# Patient Record
Sex: Male | Born: 1960 | Race: White | Hispanic: No | Marital: Single | Smoking: Never smoker
Health system: Southern US, Community
[De-identification: ages and names within clinical notes are randomized; demographics above are authoritative.]

## PROBLEM LIST (undated history)

## (undated) DIAGNOSIS — T7840XA Allergy, unspecified, initial encounter: Secondary | ICD-10-CM

## (undated) DIAGNOSIS — F329 Major depressive disorder, single episode, unspecified: Secondary | ICD-10-CM

## (undated) DIAGNOSIS — G4700 Insomnia, unspecified: Secondary | ICD-10-CM

## (undated) DIAGNOSIS — E669 Obesity, unspecified: Secondary | ICD-10-CM

## (undated) DIAGNOSIS — G4730 Sleep apnea, unspecified: Secondary | ICD-10-CM

## (undated) DIAGNOSIS — I10 Essential (primary) hypertension: Secondary | ICD-10-CM

## (undated) DIAGNOSIS — L649 Androgenic alopecia, unspecified: Secondary | ICD-10-CM

## (undated) DIAGNOSIS — F32A Depression, unspecified: Secondary | ICD-10-CM

## (undated) DIAGNOSIS — F419 Anxiety disorder, unspecified: Secondary | ICD-10-CM

## (undated) DIAGNOSIS — E291 Testicular hypofunction: Secondary | ICD-10-CM

## (undated) DIAGNOSIS — E785 Hyperlipidemia, unspecified: Secondary | ICD-10-CM

## (undated) HISTORY — DX: Androgenic alopecia, unspecified: L64.9

## (undated) HISTORY — DX: Hyperlipidemia, unspecified: E78.5

## (undated) HISTORY — PX: NASAL SEPTUM SURGERY: SHX37

## (undated) HISTORY — DX: Major depressive disorder, single episode, unspecified: F32.9

## (undated) HISTORY — DX: Sleep apnea, unspecified: G47.30

## (undated) HISTORY — DX: Depression, unspecified: F32.A

## (undated) HISTORY — DX: Obesity, unspecified: E66.9

## (undated) HISTORY — DX: Allergy, unspecified, initial encounter: T78.40XA

## (undated) HISTORY — DX: Testicular hypofunction: E29.1

## (undated) HISTORY — DX: Insomnia, unspecified: G47.00

## (undated) HISTORY — DX: Anxiety disorder, unspecified: F41.9

## (undated) HISTORY — DX: Essential (primary) hypertension: I10

---

## 1997-09-05 ENCOUNTER — Ambulatory Visit: Admission: RE | Admit: 1997-09-05 | Discharge: 1997-09-05 | Payer: Self-pay | Admitting: *Deleted

## 1997-09-23 ENCOUNTER — Ambulatory Visit: Admission: RE | Admit: 1997-09-23 | Discharge: 1997-09-23 | Payer: Self-pay | Admitting: *Deleted

## 1998-01-26 ENCOUNTER — Encounter: Admission: RE | Admit: 1998-01-26 | Discharge: 1998-02-10 | Payer: Self-pay | Admitting: Neurosurgery

## 1999-11-11 ENCOUNTER — Encounter: Admission: RE | Admit: 1999-11-11 | Discharge: 2000-02-09 | Payer: Self-pay | Admitting: Pulmonary Disease

## 2002-03-14 ENCOUNTER — Ambulatory Visit (HOSPITAL_BASED_OUTPATIENT_CLINIC_OR_DEPARTMENT_OTHER): Admission: RE | Admit: 2002-03-14 | Discharge: 2002-03-14 | Payer: Self-pay | Admitting: Pulmonary Disease

## 2004-07-12 ENCOUNTER — Ambulatory Visit: Payer: Self-pay | Admitting: Pulmonary Disease

## 2004-08-12 ENCOUNTER — Ambulatory Visit (HOSPITAL_BASED_OUTPATIENT_CLINIC_OR_DEPARTMENT_OTHER): Admission: RE | Admit: 2004-08-12 | Discharge: 2004-08-12 | Payer: Self-pay | Admitting: Pulmonary Disease

## 2004-08-16 ENCOUNTER — Ambulatory Visit: Payer: Self-pay | Admitting: Pulmonary Disease

## 2004-09-01 ENCOUNTER — Ambulatory Visit: Payer: Self-pay | Admitting: Pulmonary Disease

## 2004-11-10 ENCOUNTER — Ambulatory Visit (HOSPITAL_COMMUNITY): Admission: RE | Admit: 2004-11-10 | Discharge: 2004-11-11 | Payer: Self-pay | Admitting: Otolaryngology

## 2005-06-13 ENCOUNTER — Ambulatory Visit (HOSPITAL_COMMUNITY): Admission: RE | Admit: 2005-06-13 | Discharge: 2005-06-13 | Payer: Self-pay | Admitting: Pulmonary Disease

## 2005-07-04 ENCOUNTER — Ambulatory Visit: Payer: Self-pay | Admitting: Family Medicine

## 2005-10-05 ENCOUNTER — Ambulatory Visit: Payer: Self-pay | Admitting: Family Medicine

## 2006-01-18 ENCOUNTER — Ambulatory Visit: Payer: Self-pay | Admitting: Family Medicine

## 2006-02-05 ENCOUNTER — Ambulatory Visit: Payer: Self-pay | Admitting: Family Medicine

## 2006-02-15 ENCOUNTER — Ambulatory Visit: Payer: Self-pay | Admitting: Family Medicine

## 2006-05-09 ENCOUNTER — Ambulatory Visit: Payer: Self-pay | Admitting: Family Medicine

## 2006-05-23 ENCOUNTER — Ambulatory Visit: Payer: Self-pay | Admitting: Family Medicine

## 2006-12-25 ENCOUNTER — Ambulatory Visit: Payer: Self-pay | Admitting: Family Medicine

## 2007-01-01 ENCOUNTER — Ambulatory Visit: Payer: Self-pay | Admitting: Family Medicine

## 2007-01-08 ENCOUNTER — Ambulatory Visit: Payer: Self-pay | Admitting: Family Medicine

## 2007-01-18 ENCOUNTER — Ambulatory Visit: Payer: Self-pay | Admitting: Family Medicine

## 2007-02-14 ENCOUNTER — Ambulatory Visit: Payer: Self-pay | Admitting: Family Medicine

## 2007-02-27 ENCOUNTER — Ambulatory Visit: Payer: Self-pay | Admitting: Family Medicine

## 2007-03-01 ENCOUNTER — Ambulatory Visit: Payer: Self-pay | Admitting: Family Medicine

## 2007-04-04 ENCOUNTER — Ambulatory Visit: Payer: Self-pay | Admitting: Family Medicine

## 2007-05-17 ENCOUNTER — Ambulatory Visit: Payer: Self-pay | Admitting: Family Medicine

## 2007-11-26 ENCOUNTER — Ambulatory Visit: Payer: Self-pay | Admitting: Family Medicine

## 2007-11-27 IMAGING — CT CT MAXILLOFACIAL W/O CM
1 of 2 series · 16 of 30 positions shown, 20 images · IV contrast (agent unspecified)
Comparison: None.

CLINICAL DATA: Chronic sinusitis.  History of septoplasty 20 years ago and nasal surgery 3223. 
 MAXILLOFACIAL CT WITHOUT CONTRAST:
TECHNIQUE: Axial and coronal CT imaging was performed through the maxillofacial structures.  No intravenous contrast was administered.

[Series 3: recon 2: sinus prone · axial · 0.33mm/px · z∈[+139,+230]mm · 16 of 40 slices shown, 20 images]
[im 3/40  brain]
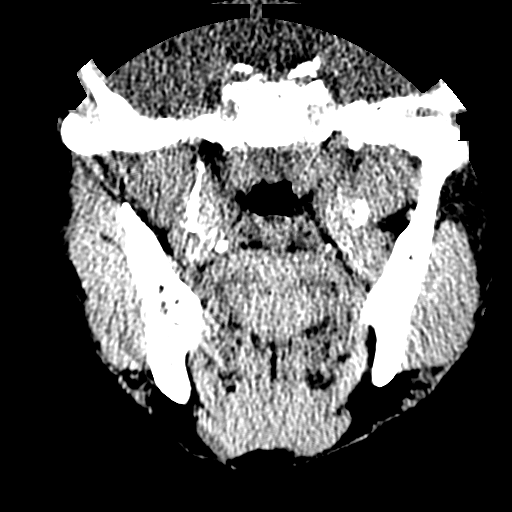
[im 3/40  bone]
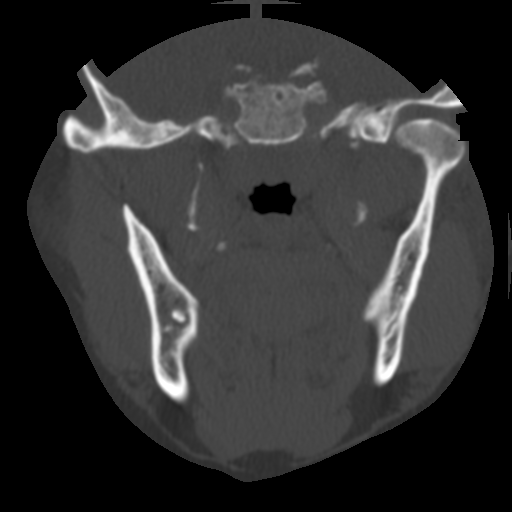
[im 5/40  bone]
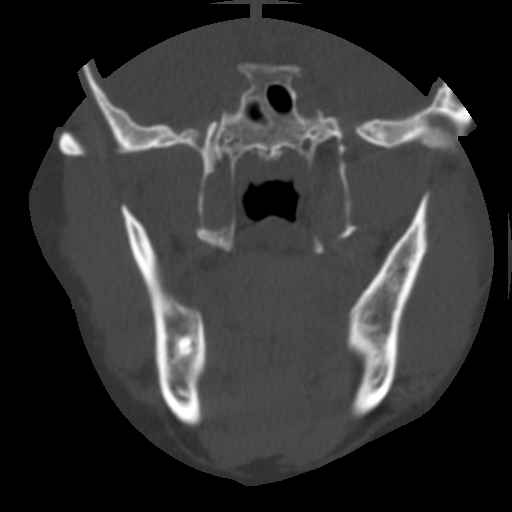
[im 7/40  bone]
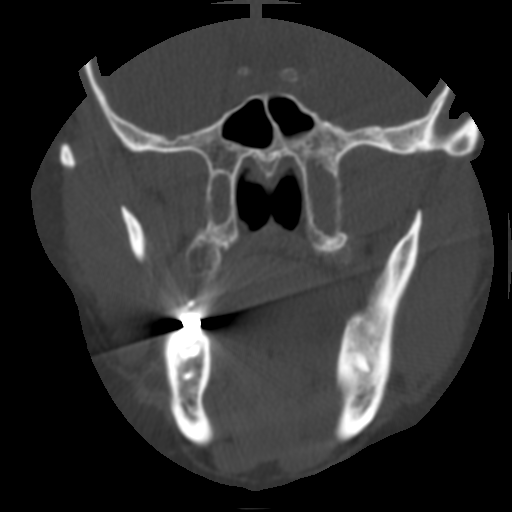
[im 10/40  bone]
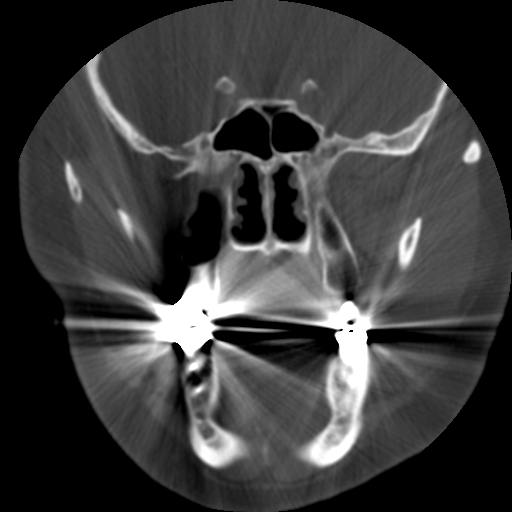
[im 12/40  brain]
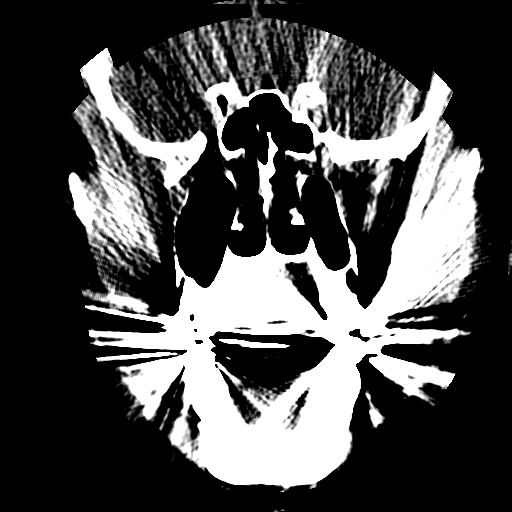
[im 12/40  bone]
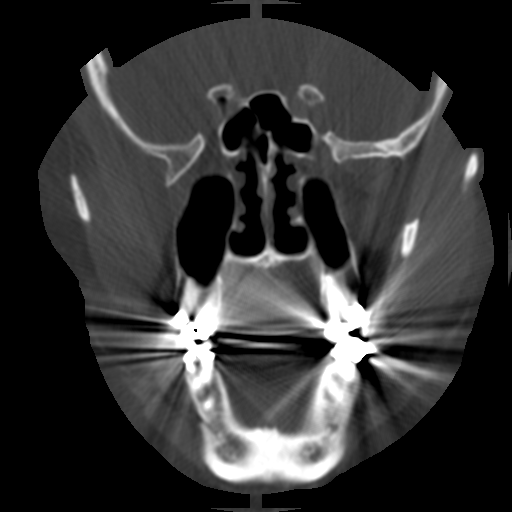
[im 14/40  bone]
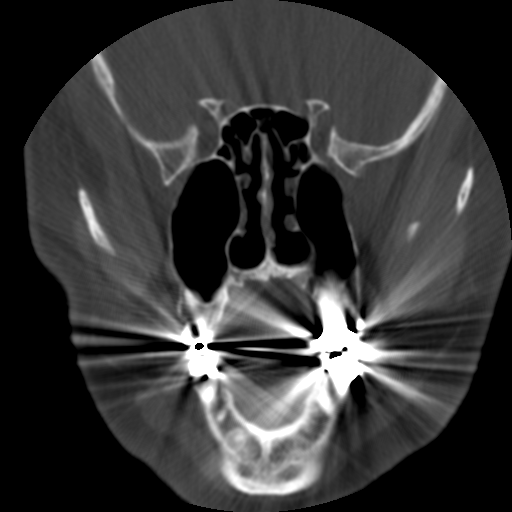
[im 17/40  bone]
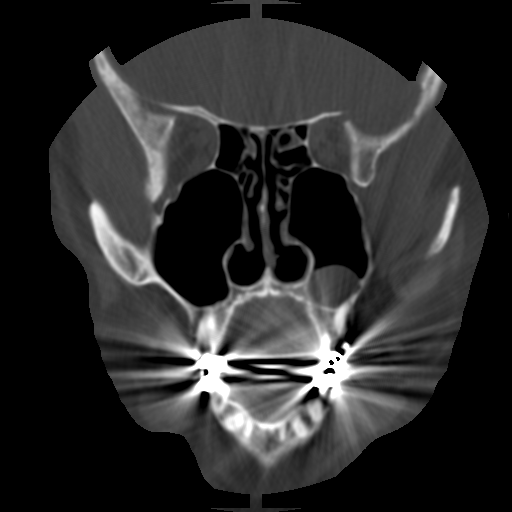
[im 19/40  bone]
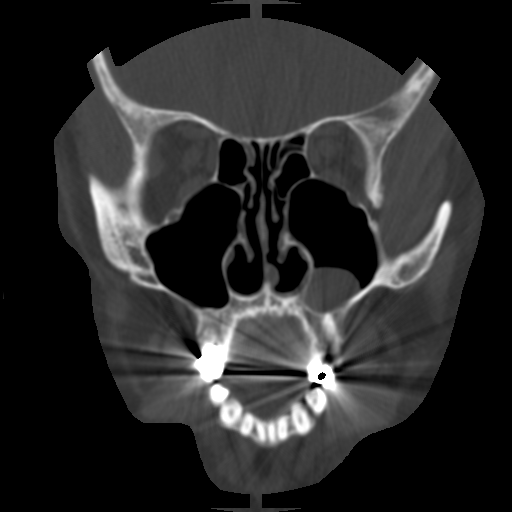
[im 21/40  brain]
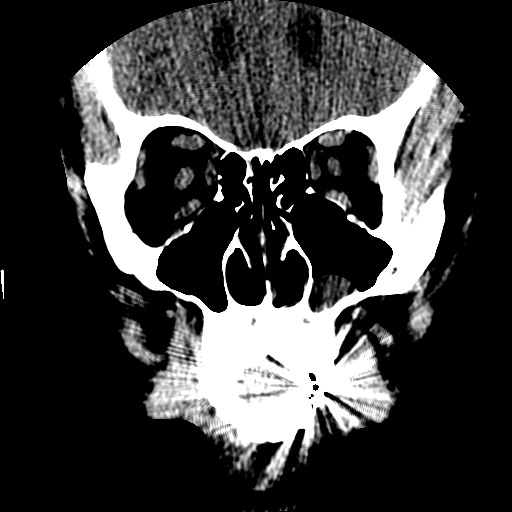
[im 21/40  bone]
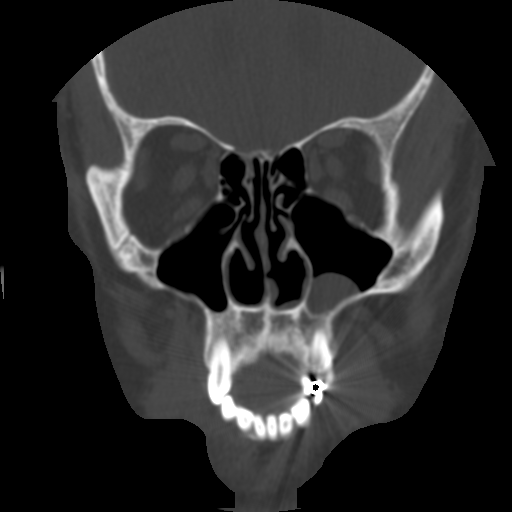
[im 23/40  bone]
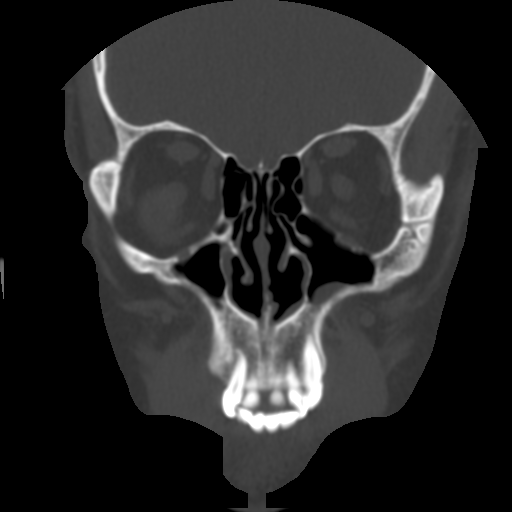
[im 26/40  bone]
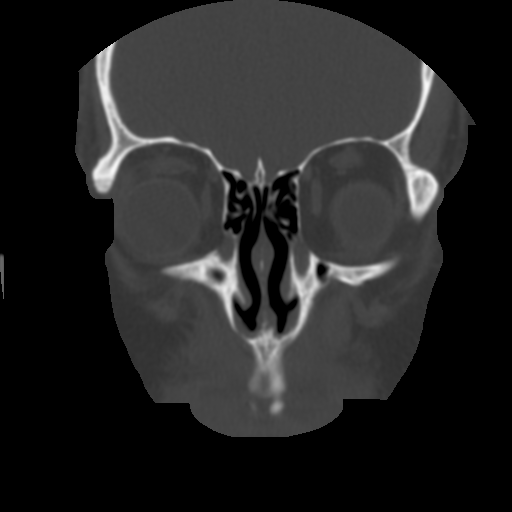
[im 28/40  bone]
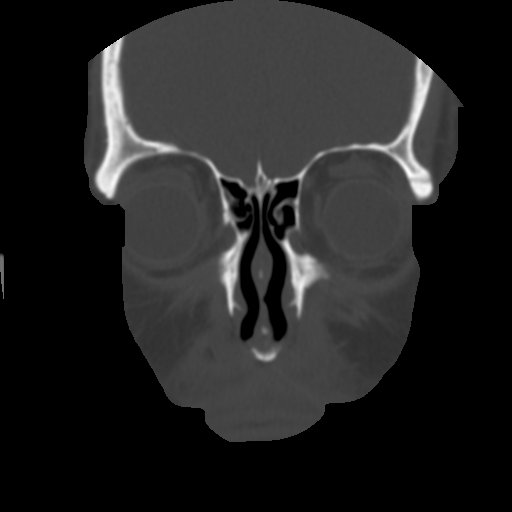
[im 30/40  brain]
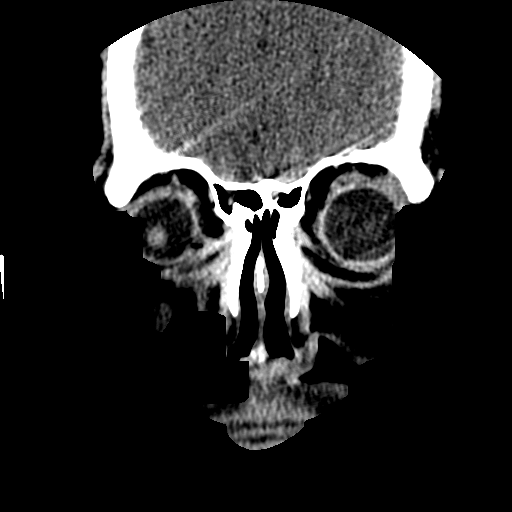
[im 30/40  bone]
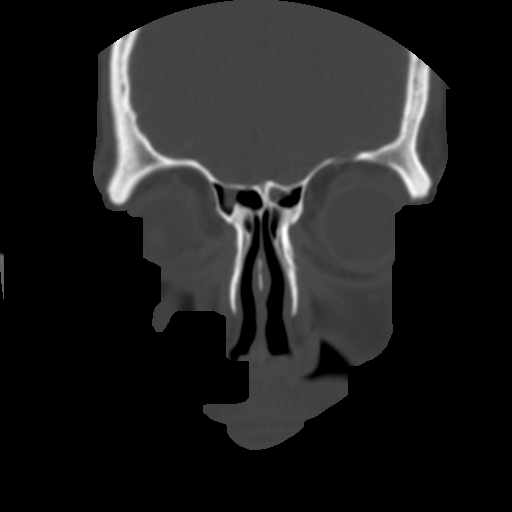
[im 33/40  bone]
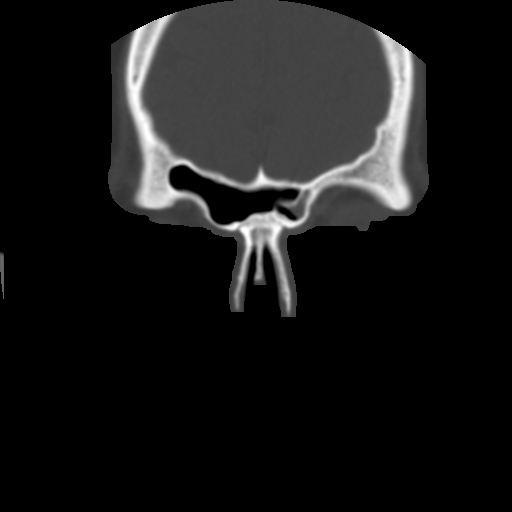
[im 35/40  bone]
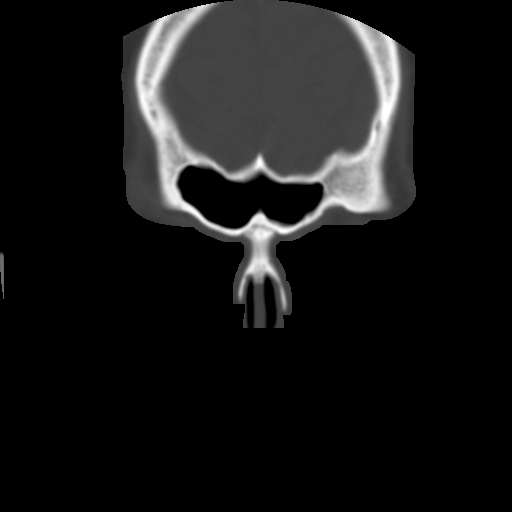
[im 37/40  bone]
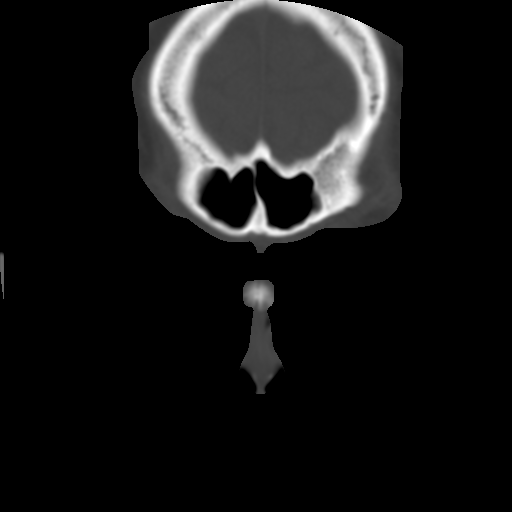

[16 of 30 positions shown; findings below may reference images not displayed]

FINDINGS: There is a retention cyst in the base of the right maxillary antrum.  There is some mild mucosal edema in the left maxillary sinus and some mild mucosal edema in the ostium of the left maxillary sinus.  The ostiomeatal complex is patent bilaterally.  There are no air-fluid levels.  There is some mucosal thickening in the frontal sinuses bilaterally. There is no bone destruction.  The nasal septum is midline.
IMPRESSION: Sinus mucosal disease as above without air-fluid level.

## 2007-12-03 ENCOUNTER — Ambulatory Visit: Payer: Self-pay | Admitting: Family Medicine

## 2008-01-10 ENCOUNTER — Ambulatory Visit: Payer: Self-pay | Admitting: Family Medicine

## 2008-01-14 ENCOUNTER — Ambulatory Visit: Payer: Self-pay | Admitting: Family Medicine

## 2008-03-10 ENCOUNTER — Ambulatory Visit: Payer: Self-pay | Admitting: Family Medicine

## 2009-01-11 ENCOUNTER — Ambulatory Visit: Payer: Self-pay | Admitting: Family Medicine

## 2009-02-15 ENCOUNTER — Ambulatory Visit: Payer: Self-pay | Admitting: Family Medicine

## 2009-03-09 ENCOUNTER — Ambulatory Visit: Payer: Self-pay | Admitting: Family Medicine

## 2009-04-08 ENCOUNTER — Ambulatory Visit: Payer: Self-pay | Admitting: Family Medicine

## 2009-05-04 ENCOUNTER — Ambulatory Visit: Payer: Self-pay | Admitting: Family Medicine

## 2009-10-25 ENCOUNTER — Ambulatory Visit: Payer: Self-pay | Admitting: Family Medicine

## 2009-11-02 ENCOUNTER — Ambulatory Visit: Payer: Self-pay | Admitting: Family Medicine

## 2009-11-25 ENCOUNTER — Ambulatory Visit: Payer: Self-pay | Admitting: Family Medicine

## 2009-12-27 ENCOUNTER — Ambulatory Visit: Payer: Self-pay | Admitting: Family Medicine

## 2010-05-19 ENCOUNTER — Encounter: Payer: Self-pay | Admitting: Family Medicine

## 2010-06-17 NOTE — Op Note (Signed)
Thomas Blankenship, Thomas Blankenship NO.:  [id number]   MEDICAL RECORD NO.:  [id number]          PATIENT TYPE:  INP   LOCATION:  2899                         FACILITY:  [facility]   PHYSICIAN:  Lucky Cowboy, MD         DATE OF BIRTH:  30-Dec-1960   DATE OF PROCEDURE:  11/10/2004  DATE OF DISCHARGE:                                 OPERATIVE REPORT   PREOPERATIVE DIAGNOSIS:  Bilateral inferior turbinate hypertrophy with a  severe obstructive sleep apnea.   POSTOPERATIVE DIAGNOSIS:  Bilateral inferior turbinate hypertrophy with a  severe obstructive sleep apnea.   PROCEDURE:  Bilateral inferior turbinate reductions.   SURGEON:  Dr. Lucky Cowboy.   ANESTHESIA:  General endotracheal anesthesia.   ESTIMATED BLOOD LOSS:  Less than 50 mL.   SPECIMENS:  None.   COMPLICATIONS:  None.   INDICATIONS:  The patient is a 50 year old male with a 2-year history of  nasal congestion causing difficulty tolerating the CPAP machine.  He has an  RDI, a respiratory disturbance index of 75. He has been trying to wear the  CPAP machine at night, but he is waking up with a morning headache. He is  having difficulty wearing the CPAP machine in the morning due to nasal  congestion. He is using Flonase, Astelin, Singulair and Claritin without  relief of nasal congestion. He has undergone septoplasty but is having  obstructing inferior turbinate hypertrophy and for these reasons, turbinate  reduction is performed.   FINDINGS:  The patient was noted to have both bony and mucosal inferior  turbinate hypertrophy.   PROCEDURE:  The patient was taken to the operating room and placed under  general endotracheal anesthesia. The table was rotated counterclockwise 45  degrees. Each of the nasal cavities were decongested with Afrin on cottonoid  pledgets. Both the inferior turbinates were injected with 1% lidocaine with  1:100,000 of epinephrine. After allowing time for vasoconstrictive effect,  the inferior  turbinates were reduced with the microdebrider. This allowed  exposure of the bony inferior turbinates which were taken down using the  through cut forceps. There was much more bone on the left side. The suction  cautery was then used to performed hemostasis. Bactroban ointment coated  rolled Telfa was then placed in each of the nasal cavities. There were tied  to one another anterior to the columella. The oral  cavity was suctioned. The table was rotated counterclockwise 45 degrees to  its original position. The patient was awakened from anesthesia and taken to  the Post Anesthesia Care Unit in stable condition. He will be observed  overnight in 3300 and discharged in the morning.      Lucky Cowboy, MD  Electronically Signed     SJ/MEDQ  D:  11/10/2004  T:  11/10/2004  Job:  [id number]   cc:   Sharlot Gowda, M.D.  Fax: [phone]

## 2010-06-17 NOTE — Procedures (Signed)
Thomas Blankenship, Thomas Blankenship NO.:  [id number]   MEDICAL RECORD NO.:  [id number]          PATIENT TYPE:  OUT   LOCATION:  SLEEP CENTER                 FACILITY:  [facility]   PHYSICIAN:  Marcelyn Bruins, M.D. [facility] DATE OF BIRTH:  1960-11-18   DATE OF STUDY:  08/12/2004                              NOCTURNAL POLYSOMNOGRAM   REFERRING PHYSICIAN:  Oley Balm. Sung Amabile, M.D.   INDICATION FOR STUDY:  Hypersomnia with sleep apnea. The patient has been  diagnosed previously with sleep apnea and returns for pressure optimization  because of ongoing symptoms.   EPWORTH SCORE: 12.   SLEEP ARCHITECTURE:  The patient had a total sleep time of 370 minutes with  decreased REM but adequate slow wave sleep. Sleep onset latency was normal  and REM onset was very prolonged.   IMPRESSION:  1.  CPAP titration study reveals good control of previously documented      obstructive sleep apnea with 13-16 cm of water pressure. The patient had      resolution of his apneas at 13, however, had breakthrough snoring until      his pressure was increased to 16. The patient used his usual mask from      home.  2.  No clinically significant cardiac arrhythmias.  3.  Very large numbers of leg jerks with moderate sleep disruption. Clinical      correlation is suggested. Does the patient have a history suggestive of      the restless leg syndrome? His leg movements may be contributing to his      persistent daytime sleepiness.     ______________________________  Suzzette Righter    KC/MEDQ  D:  08/16/2004 17:08:59  T:  08/16/2004 23:40:14  Job:  [id number]

## 2010-07-05 ENCOUNTER — Other Ambulatory Visit: Payer: Self-pay | Admitting: Family Medicine

## 2010-07-06 NOTE — Telephone Encounter (Signed)
Please review refill request 

## 2010-07-07 ENCOUNTER — Telehealth: Payer: Self-pay | Admitting: Family Medicine

## 2010-07-07 ENCOUNTER — Other Ambulatory Visit: Payer: Self-pay | Admitting: Family Medicine

## 2010-07-07 DIAGNOSIS — E291 Testicular hypofunction: Secondary | ICD-10-CM

## 2010-07-07 MED ORDER — TESTOSTERONE 12.5 MG/ACT (1%) TD GEL: 4.0000 | Freq: Every day | TRANSDERMAL | Status: DC

## 2010-07-07 NOTE — Telephone Encounter (Signed)
Left msg for pt to call for appt per JCL-lm

## 2010-07-07 NOTE — Telephone Encounter (Signed)
Forwarded to [facility]

## 2010-07-12 ENCOUNTER — Encounter: Payer: Self-pay | Admitting: Family Medicine

## 2010-07-12 ENCOUNTER — Ambulatory Visit (INDEPENDENT_AMBULATORY_CARE_PROVIDER_SITE_OTHER): Payer: BC Managed Care – PPO | Admitting: Family Medicine

## 2010-07-12 DIAGNOSIS — E291 Testicular hypofunction: Secondary | ICD-10-CM

## 2010-07-12 DIAGNOSIS — F329 Major depressive disorder, single episode, unspecified: Secondary | ICD-10-CM

## 2010-07-12 DIAGNOSIS — Z79899 Other long term (current) drug therapy: Secondary | ICD-10-CM

## 2010-07-12 DIAGNOSIS — E669 Obesity, unspecified: Secondary | ICD-10-CM

## 2010-07-12 DIAGNOSIS — G4730 Sleep apnea, unspecified: Secondary | ICD-10-CM

## 2010-07-12 DIAGNOSIS — I10 Essential (primary) hypertension: Secondary | ICD-10-CM | POA: Insufficient documentation

## 2010-07-12 LAB — CBC WITH DIFFERENTIAL/PLATELET
Basophils Absolute: 0 10*3/uL (ref 0.0–0.1)
Basophils Relative: 1 % (ref 0–1)
Eosinophils Absolute: 0.2 10*3/uL (ref 0.0–0.7)
HCT: 51.6 % (ref 39.0–52.0)
Hemoglobin: 17.7 g/dL — ABNORMAL HIGH (ref 13.0–17.0)
Lymphocytes Relative: 30 % (ref 12–46)
Lymphs Abs: 1.8 10*3/uL (ref 0.7–4.0)
MCH: 30.4 pg (ref 26.0–34.0)
MCHC: 34.3 g/dL (ref 30.0–36.0)
MCV: 88.5 fL (ref 78.0–100.0)
Monocytes Relative: 11 % (ref 3–12)
Neutro Abs: 3.5 10*3/uL (ref 1.7–7.7)
Neutrophils Relative %: 56 % (ref 43–77)
Platelets: 221 10*3/uL (ref 150–400)
RBC: 5.83 MIL/uL — ABNORMAL HIGH (ref 4.22–5.81)
WBC: 6.2 10*3/uL (ref 4.0–10.5)

## 2010-07-12 LAB — COMPREHENSIVE METABOLIC PANEL
ALT: 24 U/L (ref 0–53)
AST: 23 U/L (ref 0–37)
Albumin: 4.4 g/dL (ref 3.5–5.2)
Alkaline Phosphatase: 73 U/L (ref 39–117)
BUN: 18 mg/dL (ref 6–23)
CO2: 29 mEq/L (ref 19–32)
Calcium: 9.6 mg/dL (ref 8.4–10.5)
Creat: 1.23 mg/dL (ref 0.50–1.35)
Glucose, Bld: 90 mg/dL (ref 70–99)
Potassium: 4.3 mEq/L (ref 3.5–5.3)
Sodium: 141 mEq/L (ref 135–145)
Total Bilirubin: 0.6 mg/dL (ref 0.3–1.2)
Total Protein: 7.1 g/dL (ref 6.0–8.3)

## 2010-07-12 LAB — PSA: PSA: 0.3 ng/mL (ref <=4.00)

## 2010-07-12 NOTE — Progress Notes (Signed)
  Subjective:    Patient ID: Thomas Blankenship, male    DOB: 25-Dec-1960, 49 y.o.   MRN: [id number]  HPI He is here for a recheck. He continues on his testosterone replacement and is doing well on that. He continues to use his CPAP machine. Is now using Ambien. Continues on his blood pressure medicine and is having no difficulties. He continues to be followed by Dr. Donell Beers. He has started walking again.  Review of Systems    negative except as above Objective:   Physical Exam Alert and in no distress otherwise not examined       Assessment & Plan:  Hypogonadism. Hypertension. Obesity. Depression Routine blood work will be done including PSA and testosterone

## 2010-07-13 ENCOUNTER — Telehealth: Payer: Self-pay

## 2010-07-13 NOTE — Telephone Encounter (Signed)
Py informed of labs and to recheck in 1 month

## 2010-09-05 ENCOUNTER — Other Ambulatory Visit: Payer: Self-pay | Admitting: Family Medicine

## 2010-09-05 MED ORDER — MONTELUKAST SODIUM 10 MG PO TABS: 10.0000 mg | ORAL_TABLET | Freq: Every day | ORAL | Status: DC

## 2010-09-08 ENCOUNTER — Telehealth: Payer: Self-pay | Admitting: *Deleted

## 2010-09-08 NOTE — Telephone Encounter (Signed)
Done

## 2010-09-14 ENCOUNTER — Ambulatory Visit (INDEPENDENT_AMBULATORY_CARE_PROVIDER_SITE_OTHER): Payer: BC Managed Care – PPO | Admitting: Medical

## 2010-09-14 ENCOUNTER — Encounter: Payer: Self-pay | Admitting: Family Medicine

## 2010-09-14 ENCOUNTER — Encounter: Payer: Self-pay | Admitting: Medical

## 2010-09-14 VITALS — BP 120/72 | HR 100 | Temp 98.6°F | Resp 20 | Ht 65.0 in | Wt 251.0 lb

## 2010-09-14 DIAGNOSIS — H60399 Other infective otitis externa, unspecified ear: Secondary | ICD-10-CM

## 2010-09-14 DIAGNOSIS — H6090 Unspecified otitis externa, unspecified ear: Secondary | ICD-10-CM

## 2010-09-14 DIAGNOSIS — G4700 Insomnia, unspecified: Secondary | ICD-10-CM

## 2010-09-14 DIAGNOSIS — J309 Allergic rhinitis, unspecified: Secondary | ICD-10-CM

## 2010-09-14 MED ORDER — NEOMYCIN-POLYMYXIN-HC 3.5-10000-1 OT SUSP: 3.0000 [drp] | Freq: Four times a day (QID) | OTIC | Status: AC

## 2010-09-14 NOTE — Patient Instructions (Signed)
Swimmer's Ear (Otitis Externa) Otitis externa ("swimmer's ear") is a germ (bacterial) or fungal infection of the outer ear canal (from the eardrum to the outside of the ear). Swimming in dirty water may cause swimmer's ear. It also may be caused by moisture in the ear from water remaining after swimming or bathing. Often the first signs of infection may be itching in the ear canal. This may progress to ear canal swelling, redness, and pus drainage which may be signs of infection. HOME CARE INSTRUCTIONS  Apply the antibiotic drops to the ear canal as prescribed by your doctor.   This can be a very painful medical condition. A strong pain reliever may be prescribed.   Only take over-the-counter or prescription medicines for pain, discomfort, or fever as directed by your caregiver.   If your caregiver has given you a follow-up appointment, it is very important to keep that appointment. Not keeping the appointment could result in a chronic or permanent injury, pain, hearing loss and disability. If there is any problem keeping the appointment, you must call back to this facility for assistance.  PREVENTION  It is important to keep your ear dry. Use the corner of a towel to wick water out of the ear canal after swimming or bathing.   Avoid scratching in your ear. This can damage the ear canal or remove the protective wax lining the canal and make it easier for germs (bacteria) or a fungus to grow.   You may use ear drops made of rubbing alcohol and vinegar after swimming to prevent future "swimmer ear" infections. Make up a small bottle of equal parts white vinegar and alcohol. Put 3 or 4 drops into each ear after swimming.   Avoid swimming in lakes, polluted water, or poorly chlorinated pools.  SEEK MEDICAL CARE IF:  An oral temperature above 101 develops.   Your ear is still painful after 3 days and shows signs of getting worse (redness, swelling, pain, or pus).  MAKE SURE YOU:   Understand  these instructions.   Will watch your condition.   Will get help right away if you are not doing well or get worse.  Document Released: 01/16/2005 Document Re-Released: 12/30/2007 [employer] Patient Information 2011 [employer]. 

## 2010-09-14 NOTE — Progress Notes (Signed)
Subjective:     Thomas Blankenship is a 50 y.o. male who presents for evaluation of right ear pain. Symptoms have been present for 2 days.He does not have a history of ear infections. He does not have a history of recent swimming.  We received prior authorization for zolpidem and singular.  He apparently has been on both medications for >1 year.  He uses both medication regularly.  He is also on antihistamine, Flonase and Astelin nasal spray for allergy symptoms.    The patient's history has been marked as reviewed and updated as appropriate.   Past Medical History  Diagnosis Date  . Obesity   . Allergy     RHINITIS  . Sleep apnea   . Male pattern baldness   . Hypogonadism male     Review of Systems Gen: no fever, chills, sweats ENT: no ST, cough, runny nose Lungs: no cough, SOB  Objective:    BP 120/72  Pulse 100  Temp(Src) 98.6 F (37 C) (Oral)  Resp 20  Ht 5\' 5"  (1.651 m)  Wt 251 lb (113.853 kg)  BMI 41.77 kg/m2 General:  alert and cooperative  Right Ear: Swollen canal, slight purulent discharge, tender on exam  Left Ear: left TM normal landmarks and mobility and left canal normal  Mouth:  lips, mucosa, and tongue normal; teeth and gums normal  Neck: no adenopathy, supple, non tender       Assessment:   Encounter Diagnoses  Name Primary?  . Otitis externa Yes  . Allergic rhinitis   . Insomnia      Plan:    Treatment: Cortisporin suspension, otic. OTC analgesia as needed. Water exclusion from affected ear until symptoms resolve. Follow up in 1 week if symptoms not improving.    We will send prior auth for Zolpidem and Singulair.

## 2010-09-20 ENCOUNTER — Telehealth: Payer: Self-pay | Admitting: Family Medicine

## 2010-09-20 NOTE — Telephone Encounter (Signed)
LEFT MSG FOR PT THAT PA'S APPROVED-LM 8/21

## 2010-09-22 ENCOUNTER — Encounter: Payer: Self-pay | Admitting: Family Medicine

## 2010-09-22 ENCOUNTER — Ambulatory Visit (INDEPENDENT_AMBULATORY_CARE_PROVIDER_SITE_OTHER): Payer: BC Managed Care – PPO | Admitting: Family Medicine

## 2010-09-22 VITALS — BP 118/70 | HR 76 | Wt 250.0 lb

## 2010-09-22 DIAGNOSIS — I10 Essential (primary) hypertension: Secondary | ICD-10-CM

## 2010-09-22 DIAGNOSIS — J301 Allergic rhinitis due to pollen: Secondary | ICD-10-CM

## 2010-09-22 DIAGNOSIS — G4730 Sleep apnea, unspecified: Secondary | ICD-10-CM

## 2010-09-22 DIAGNOSIS — Z79899 Other long term (current) drug therapy: Secondary | ICD-10-CM

## 2010-09-22 DIAGNOSIS — E291 Testicular hypofunction: Secondary | ICD-10-CM

## 2010-09-22 DIAGNOSIS — F341 Dysthymic disorder: Secondary | ICD-10-CM

## 2010-09-22 DIAGNOSIS — Z23 Encounter for immunization: Secondary | ICD-10-CM

## 2010-09-22 LAB — LIPID PANEL
Cholesterol: 184 mg/dL (ref 0–200)
HDL: 40 mg/dL (ref >39)
LDL Cholesterol: 107 mg/dL — ABNORMAL HIGH (ref 0–99)
Total CHOL/HDL Ratio: 4.6 Ratio
Triglycerides: 186 mg/dL — ABNORMAL HIGH (ref <150)
VLDL: 37 mg/dL (ref 0–40)

## 2010-09-22 LAB — COMPREHENSIVE METABOLIC PANEL
ALT: 23 U/L (ref 0–53)
AST: 21 U/L (ref 0–37)
Albumin: 3.9 g/dL (ref 3.5–5.2)
Alkaline Phosphatase: 69 U/L (ref 39–117)
BUN: 14 mg/dL (ref 6–23)
CO2: 30 mEq/L (ref 19–32)
Calcium: 9.7 mg/dL (ref 8.4–10.5)
Chloride: 102 mEq/L (ref 96–112)
Creat: 1.02 mg/dL (ref 0.50–1.35)
Glucose, Bld: 87 mg/dL (ref 70–99)
Potassium: 4.9 mEq/L (ref 3.5–5.3)
Sodium: 139 mEq/L (ref 135–145)
Total Bilirubin: 0.5 mg/dL (ref 0.3–1.2)
Total Protein: 6.5 g/dL (ref 6.0–8.3)

## 2010-09-22 LAB — CBC WITH DIFFERENTIAL/PLATELET
Basophils Absolute: 0 10*3/uL (ref 0.0–0.1)
Basophils Relative: 0 % (ref 0–1)
Eosinophils Absolute: 0.2 10*3/uL (ref 0.0–0.7)
Eosinophils Relative: 4 % (ref 0–5)
HCT: 49.3 % (ref 39.0–52.0)
Hemoglobin: 16.6 g/dL (ref 13.0–17.0)
Lymphocytes Relative: 31 % (ref 12–46)
Lymphs Abs: 1.8 10*3/uL (ref 0.7–4.0)
MCH: 30.1 pg (ref 26.0–34.0)
MCHC: 33.7 g/dL (ref 30.0–36.0)
MCV: 89.5 fL (ref 78.0–100.0)
Monocytes Absolute: 0.6 10*3/uL (ref 0.1–1.0)
Monocytes Relative: 11 % (ref 3–12)
Neutro Abs: 3.1 10*3/uL (ref 1.7–7.7)
Neutrophils Relative %: 54 % (ref 43–77)
Platelets: 293 10*3/uL (ref 150–400)
RBC: 5.51 MIL/uL (ref 4.22–5.81)
RDW: 12.8 % (ref 11.5–15.5)
WBC: 5.8 10*3/uL (ref 4.0–10.5)

## 2010-09-22 LAB — PSA: PSA: 0.64 ng/mL (ref <=4.00)

## 2010-09-22 MED ORDER — TESTOSTERONE 12.5 MG/ACT (1%) TD GEL: 4.0000 | Freq: Every day | TRANSDERMAL | Status: DC

## 2010-09-22 NOTE — Progress Notes (Signed)
  Subjective:    Patient ID: Thomas Blankenship, male    DOB: [birth year], 50 y.o.   MRN: [id number]  HPI He is here for an interval evaluation. He ran out of his testosterone several weeks ago and did not get it refilled. He continues on his blood pressure medicine and is having no difficulty with this. He uses his CPAP machine and still does use a sleep med. His allergies seem to be under fairly good control. His weight is stable. He continues in counseling and seems to be making progress concerning his anxiety.   Review of Systems Negative except as above    Objective:   Physical Exam Alert and in no distress otherwise not examined       Assessment & Plan:  Hypogonadism .Sleep apnea. Hypertension. Obesity. Dysthymia. Allergic rhinitis Continuing counseling. Continue to work on weight reduction. Routine blood screening done today. He will return here in one month for recheck on his testosterone.

## 2010-09-23 ENCOUNTER — Telehealth: Payer: Self-pay

## 2010-09-23 NOTE — Telephone Encounter (Signed)
Called pt to inform him his labs look good and continue on pres.meds

## 2010-09-26 ENCOUNTER — Ambulatory Visit: Payer: BC Managed Care – PPO | Admitting: Family Medicine

## 2010-09-27 ENCOUNTER — Other Ambulatory Visit: Payer: Self-pay

## 2010-09-27 MED ORDER — TESTOSTERONE 12.5 MG/ACT (1%) TD GEL: 4.0000 | Freq: Every day | TRANSDERMAL | Status: DC

## 2010-09-27 NOTE — Telephone Encounter (Signed)
Had to call in med 2nd time called to inform pt

## 2010-11-01 ENCOUNTER — Telehealth: Payer: Self-pay | Admitting: Family Medicine

## 2010-11-01 NOTE — Telephone Encounter (Signed)
Pt states he needs to have testosterone level checked and wants to come in for labs only is that ok please let him know and schedule the appropriate appt.

## 2010-11-02 ENCOUNTER — Telehealth: Payer: Self-pay | Admitting: Family Medicine

## 2010-11-02 NOTE — Telephone Encounter (Signed)
Done

## 2010-11-02 NOTE — Telephone Encounter (Signed)
Called pt left word ok to have nurse visit to check test. Per jcl

## 2010-11-02 NOTE — Telephone Encounter (Signed)
Having come back from the testosterone only.

## 2010-11-03 ENCOUNTER — Other Ambulatory Visit: Payer: BC Managed Care – PPO

## 2010-11-03 DIAGNOSIS — E291 Testicular hypofunction: Secondary | ICD-10-CM

## 2010-11-03 NOTE — Telephone Encounter (Signed)
I'm OK with this 

## 2010-11-04 ENCOUNTER — Telehealth: Payer: Self-pay

## 2010-11-04 NOTE — Telephone Encounter (Signed)
Pt returning your call and said can leave message on home phone to let him know what you want him to do

## 2010-11-04 NOTE — Telephone Encounter (Signed)
Testosterone was discussed with him. He alluded to the fact that in June he was not using the testosterone regularly. Ask him to reduce his dosing to 2 pumps per day and use it on his lateral rib area. He will set up an appointment for testosterone in one month.

## 2010-12-06 ENCOUNTER — Other Ambulatory Visit: Payer: Self-pay | Admitting: Family Medicine

## 2010-12-13 ENCOUNTER — Telehealth: Payer: Self-pay | Admitting: Family Medicine

## 2010-12-13 NOTE — Telephone Encounter (Signed)
Have him come in for blood draw for testosterone

## 2010-12-14 ENCOUNTER — Other Ambulatory Visit: Payer: BC Managed Care – PPO

## 2010-12-14 DIAGNOSIS — E291 Testicular hypofunction: Secondary | ICD-10-CM

## 2010-12-14 NOTE — Telephone Encounter (Signed)
Tried to leave 2 messages already for pt to call back

## 2010-12-15 LAB — TESTOSTERONE: Testosterone: 209.34 ng/dL — ABNORMAL LOW (ref 250–890)

## 2010-12-15 NOTE — Telephone Encounter (Signed)
Pt came in 11/14 for blood draw

## 2011-02-13 ENCOUNTER — Other Ambulatory Visit: Payer: BC Managed Care – PPO

## 2011-02-13 ENCOUNTER — Other Ambulatory Visit: Payer: Self-pay | Admitting: *Deleted

## 2011-02-22 ENCOUNTER — Other Ambulatory Visit: Payer: BC Managed Care – PPO

## 2011-02-22 DIAGNOSIS — E291 Testicular hypofunction: Secondary | ICD-10-CM

## 2011-02-23 LAB — TESTOSTERONE: Testosterone: 473.43 ng/dL (ref 250–890)

## 2011-03-10 ENCOUNTER — Other Ambulatory Visit: Payer: Self-pay | Admitting: Family Medicine

## 2011-04-09 ENCOUNTER — Other Ambulatory Visit: Payer: Self-pay | Admitting: Family Medicine

## 2011-04-10 ENCOUNTER — Other Ambulatory Visit: Payer: Self-pay

## 2011-04-10 NOTE — Telephone Encounter (Signed)
Called med in per jcl 

## 2011-04-10 NOTE — Telephone Encounter (Signed)
Call him the AndroGel

## 2011-04-10 NOTE — Telephone Encounter (Signed)
Is this ok?

## 2011-04-13 ENCOUNTER — Other Ambulatory Visit: Payer: Self-pay | Admitting: Family Medicine

## 2011-05-17 ENCOUNTER — Other Ambulatory Visit: Payer: Self-pay | Admitting: Family Medicine

## 2011-10-09 ENCOUNTER — Ambulatory Visit (INDEPENDENT_AMBULATORY_CARE_PROVIDER_SITE_OTHER): Payer: BC Managed Care – PPO | Admitting: Family Medicine

## 2011-10-09 ENCOUNTER — Encounter: Payer: Self-pay | Admitting: Family Medicine

## 2011-10-09 VITALS — BP 120/70 | Wt 265.0 lb

## 2011-10-09 DIAGNOSIS — M7742 Metatarsalgia, left foot: Secondary | ICD-10-CM

## 2011-10-09 DIAGNOSIS — E291 Testicular hypofunction: Secondary | ICD-10-CM

## 2011-10-09 DIAGNOSIS — Z23 Encounter for immunization: Secondary | ICD-10-CM

## 2011-10-09 DIAGNOSIS — Z79899 Other long term (current) drug therapy: Secondary | ICD-10-CM

## 2011-10-09 DIAGNOSIS — G4730 Sleep apnea, unspecified: Secondary | ICD-10-CM

## 2011-10-09 DIAGNOSIS — I10 Essential (primary) hypertension: Secondary | ICD-10-CM

## 2011-10-09 DIAGNOSIS — E669 Obesity, unspecified: Secondary | ICD-10-CM

## 2011-10-09 DIAGNOSIS — J309 Allergic rhinitis, unspecified: Secondary | ICD-10-CM | POA: Insufficient documentation

## 2011-10-09 DIAGNOSIS — F341 Dysthymic disorder: Secondary | ICD-10-CM

## 2011-10-09 DIAGNOSIS — M7750 Other enthesopathy of unspecified foot: Secondary | ICD-10-CM

## 2011-10-09 MED ORDER — AZELASTINE HCL 0.1 % NA SOLN: 2.0000 | Freq: Two times a day (BID) | NASAL | Status: DC

## 2011-10-09 MED ORDER — ZOLPIDEM TARTRATE ER 12.5 MG PO TBCR: 12.5000 mg | EXTENDED_RELEASE_TABLET | Freq: Every evening | ORAL | Status: DC | PRN

## 2011-10-09 MED ORDER — MONTELUKAST SODIUM 10 MG PO TABS: 10.0000 mg | ORAL_TABLET | Freq: Every day | ORAL | Status: DC

## 2011-10-09 MED ORDER — VENLAFAXINE HCL ER 150 MG PO CP24: 150.0000 mg | ORAL_CAPSULE | Freq: Two times a day (BID) | ORAL | Status: DC

## 2011-10-09 MED ORDER — LISINOPRIL-HYDROCHLOROTHIAZIDE 10-12.5 MG PO TABS: 1.0000 | ORAL_TABLET | Freq: Every day | ORAL | Status: DC

## 2011-10-09 MED ORDER — TESTOSTERONE 12.5 MG/ACT (1%) TD GEL: 4.0000 "application " | TRANSDERMAL | Status: DC

## 2011-10-09 MED ORDER — FLUTICASONE PROPIONATE 50 MCG/ACT NA SUSP: 2.0000 | Freq: Every day | NASAL | Status: DC

## 2011-10-09 NOTE — Progress Notes (Signed)
  Subjective:    Patient ID: Thomas Blankenship, male    DOB: [birth year], 51 y.o.   MRN: [id number]  HPI History of pain at the second MTP joint on the left. This usually bothers him when he stands for long periods of time. He has no history of injury. No other joints aches or pains. He has not been here in approximately one year. He did come in November to have testosterone drawn. He does have sleep apnea but has not had his machine tested. He will need refill on his medications. He continues to do well on his AndroGel. He does use Ambien to help with sleep especially with the CPAP. His weight is unchanged. He continues to do well on his psychotropic medication. 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  Review of Systems     Objective:   Physical Exam Alert and in no distress. Blood pressure and weight are recorded. Exam of the foot shows no pain or swelling and minimal tenderness palpation over the second metatarsal head. X-ray is negative.       Assessment & Plan:   1. Metatarsalgia of left foot    2. Hypertension  lisinopril-hydrochlorothiazide (PRINZIDE,ZESTORETIC) 10-12.5 MG per tablet  3. Hypogonadism male  Testosterone (ANDROGEL PUMP) 1.25 GM/ACT (1%) GEL, Testosterone, PSA  4. Obesity    5. Sleep apnea  zolpidem (AMBIEN CR) 12.5 MG CR tablet  6. Encounter for long-term (current) use of other medications  CBC with Differential, Comprehensive metabolic panel, Lipid panel  7. Allergic rhinitis, mild  fluticasone (FLONASE) 50 MCG/ACT nasal spray, azelastine (ASTELIN) 137 MCG/SPRAY nasal spray, montelukast (SINGULAIR) 10 MG tablet  8. Dysthymia  venlafaxine XR (EFFEXOR XR) 150 MG 24 hr capsule   recommend arch supports for his foot. We'll also get a CPAP readout.

## 2011-10-09 NOTE — Patient Instructions (Signed)
Try over the counter arch supports.

## 2011-10-10 ENCOUNTER — Other Ambulatory Visit: Payer: Self-pay

## 2011-10-10 LAB — CBC WITH DIFFERENTIAL/PLATELET
Basophils Absolute: 0.1 10*3/uL (ref 0.0–0.1)
Basophils Relative: 1 % (ref 0–1)
Eosinophils Absolute: 0.2 10*3/uL (ref 0.0–0.7)
Eosinophils Relative: 3 % (ref 0–5)
Hemoglobin: 16.5 g/dL (ref 13.0–17.0)
Lymphocytes Relative: 26 % (ref 12–46)
Lymphs Abs: 1.8 10*3/uL (ref 0.7–4.0)
MCHC: 34.4 g/dL (ref 30.0–36.0)
MCV: 86.5 fL (ref 78.0–100.0)
Monocytes Relative: 9 % (ref 3–12)
Neutro Abs: 4.2 10*3/uL (ref 1.7–7.7)
Neutrophils Relative %: 61 % (ref 43–77)
Platelets: 320 10*3/uL (ref 150–400)
RBC: 5.54 MIL/uL (ref 4.22–5.81)
RDW: 13.6 % (ref 11.5–15.5)
WBC: 6.8 10*3/uL (ref 4.0–10.5)

## 2011-10-10 LAB — LIPID PANEL
Cholesterol: 197 mg/dL (ref 0–200)
HDL: 40 mg/dL
LDL Cholesterol: 114 mg/dL — ABNORMAL HIGH (ref 0–99)
Total CHOL/HDL Ratio: 4.9 ratio
Triglycerides: 216 mg/dL — ABNORMAL HIGH
VLDL: 43 mg/dL — ABNORMAL HIGH (ref 0–40)

## 2011-10-10 LAB — COMPREHENSIVE METABOLIC PANEL
AST: 20 U/L (ref 0–37)
Albumin: 3.9 g/dL (ref 3.5–5.2)
Alkaline Phosphatase: 82 U/L (ref 39–117)
BUN: 14 mg/dL (ref 6–23)
CO2: 31 mEq/L (ref 19–32)
Chloride: 100 mEq/L (ref 96–112)
Creat: 1.13 mg/dL (ref 0.50–1.35)
Glucose, Bld: 105 mg/dL — ABNORMAL HIGH (ref 70–99)
Sodium: 138 mEq/L (ref 135–145)
Total Bilirubin: 0.6 mg/dL (ref 0.3–1.2)
Total Protein: 6.8 g/dL (ref 6.0–8.3)

## 2011-10-10 LAB — TESTOSTERONE: Testosterone: 777.29 ng/dL (ref 300–890)

## 2011-10-24 ENCOUNTER — Ambulatory Visit (INDEPENDENT_AMBULATORY_CARE_PROVIDER_SITE_OTHER): Payer: BC Managed Care – PPO | Admitting: Medical

## 2011-10-24 ENCOUNTER — Encounter: Payer: Self-pay | Admitting: Medical

## 2011-10-24 VITALS — BP 120/70 | HR 92 | Temp 98.1°F | Resp 18 | Wt 266.0 lb

## 2011-10-24 DIAGNOSIS — G4700 Insomnia, unspecified: Secondary | ICD-10-CM

## 2011-10-24 DIAGNOSIS — E669 Obesity, unspecified: Secondary | ICD-10-CM

## 2011-10-24 DIAGNOSIS — G4730 Sleep apnea, unspecified: Secondary | ICD-10-CM

## 2011-10-24 MED ORDER — MELATONIN 3 MG PO TABS: 1.0000 | ORAL_TABLET | Freq: Every day | ORAL | Status: DC

## 2011-10-24 MED ORDER — ZOLPIDEM TARTRATE 10 MG PO TABS: 10.0000 mg | ORAL_TABLET | Freq: Every evening | ORAL | Status: DC | PRN

## 2011-10-24 NOTE — Progress Notes (Signed)
Subjective: Here for issues with insomnia.   Been on Ambien for about 2 years now, and has been taking Ambien basically nightly for about 2 years.  Uses Ambien to help get to sleep. Once asleep he usually stays asleep but had problems with staying asleep in the past as well.  First started having sleep issues 10-15 years ago.  Thinks he has issues with sleep due to stress.   Using CPAP nightly for sleep apnea.     Lives at home alone.  Works as a Radio producer, [employer], teaches math.   Exercise with walking dog.  He denies reading or watching TV in bed.  Tries to keep bedroom cool at bedtime.  Current stressors include history of problems at work, death of his mother few years back, some family related stress.  Denies any current significant other.  Sometimes mind races when getting in the bed.     In the past has used Trazodone, but felt listless all the time.  Has tried some OTC remedies in the past including melatonin.    Sometimes he drinks fluids close to bedtime.   Past Medical History  Diagnosis Date  . Obesity   . Allergy     RHINITIS  . Sleep apnea   . Male pattern baldness   . Hypogonadism male   . Insomnia    Review of Systems Constitutional: -fever, -chills, -sweats, -unexpected -weight change,-fatigue [id number] Cardiology:  -chest pain, -palpitations, -edema Respiratory: -cough, -shortness of breath, -wheezing Gastroenterology: negative Ophthalmology: -vision changes Urology: -dysuria, -difficulty urinating, -hematuria, -urinary frequency, -urgency Neurology: -headache, -weakness, -tingling, -numbness   Objective: Gen: wd, wn, obese white male Neck: supple,nontender, no mass, no thyromegaly Lungs: cta Heart: somewhat pounding today, normal s1, s2, no obvious murmur gallop or rubs Ext: no edema Pulses normal  Assessment: Encounter Diagnoses  Name Primary?  . Insomnia Yes  . Sleep apnea   . Obesity    Plan: Insomnia - gave weaning schedule to try and  slowly cut down on Ambien frequency.  I did change to regular zolpidem instead of CR today.  Discussed sleep hygiene, begin metalonin 3mg  and benadryl QHS OTC.  Recheck 1-2 mo.  Handout given.   Sleep apnea - c/t CPAP  Obesity - need to work on diet along with exercise for weight loss changes.  RTC 1-31mo for f/u and CPX.

## 2011-10-24 NOTE — Patient Instructions (Signed)
Begin Melatonin 3mg  tablet daily at bedtime.   Also begin benadryl OTC 25-50mg  nightly at bedtime for now.    Begin weaning back on Ambien by taking the Zolpidem generic 10mg  every night except skipping every 4th night.   If this is working ok, then lets continue this for a few weeks, then skip every 3rd night.  Try this for another 1-2 weeks.  Then lets try going to just as needed.  This process of weaning off the medication may take 1-3 months.    Insomnia Insomnia is frequent trouble falling and/or staying asleep. Insomnia can be a long term problem or a short term problem. Both are common. Insomnia can be a short term problem when the wakefulness is related to a certain stress or worry. Long term insomnia is often related to ongoing stress during waking hours and/or poor sleeping habits. Overtime, sleep deprivation itself can make the problem worse. Every little thing feels more severe because you are overtired and your ability to cope is decreased. CAUSES   Stress, anxiety, and depression.   Poor sleeping habits.   Distractions such as TV in the bedroom.   Naps close to bedtime.   Engaging in emotionally charged conversations before bed.   Technical reading before sleep.   Alcohol and other sedatives. They may make the problem worse. They can hurt normal sleep patterns and normal dream activity.   Stimulants such as caffeine for several hours prior to bedtime.   Pain syndromes and shortness of breath can cause insomnia.   Exercise late at night.   Changing time zones may cause sleeping problems (jet lag).  It is sometimes helpful to have someone observe your sleeping patterns. They should look for periods of not breathing during the night (sleep apnea). They should also look to see how long those periods last. If you live alone or observers are uncertain, you can also be observed at a sleep clinic where your sleep patterns will be professionally monitored. Sleep apnea requires a  checkup and treatment. Give your caregivers your medical history. Give your caregivers observations your family has made about your sleep.  SYMPTOMS   Not feeling rested in the morning.   Anxiety and restlessness at bedtime.   Difficulty falling and staying asleep.  TREATMENT   Your caregiver may prescribe treatment for an underlying medical disorders. Your caregiver can give advice or help if you are using alcohol or other drugs for self-medication. Treatment of underlying problems will usually eliminate insomnia problems.   Medications can be prescribed for short time use. They are generally not recommended for lengthy use.   Over-the-counter sleep medicines are not recommended for lengthy use. They can be habit forming.   You can promote easier sleeping by making lifestyle changes such as:   Using relaxation techniques that help with breathing and reduce muscle tension.   Exercising earlier in the day.   Changing your diet and the time of your last meal. No night time snacks.   Establish a regular time to go to bed.   Counseling can help with stressful problems and worry.   Soothing music and white noise may be helpful if there are background noises you cannot remove.   Stop tedious detailed work at least one hour before bedtime.  HOME CARE INSTRUCTIONS   Keep a diary. Inform your caregiver about your progress. This includes any medication side effects. See your caregiver regularly. Take note of:   Times when you are asleep.   Times  when you are awake during the night.   The quality of your sleep.   How you feel the next day.  This information will help your caregiver care for you.  Get out of bed if you are still awake after 15 minutes. Read or do some quiet activity. Keep the lights down. Wait until you feel sleepy and go back to bed.   Keep regular sleeping and waking hours. Avoid naps.   Exercise regularly.   Avoid distractions at bedtime. Distractions include  watching television or engaging in any intense or detailed activity like attempting to balance the household checkbook.   Develop a bedtime ritual. Keep a familiar routine of bathing, brushing your teeth, climbing into bed at the same time each night, listening to soothing music. Routines increase the success of falling to sleep faster.   Use relaxation techniques. This can be using breathing and muscle tension release routines. It can also include visualizing peaceful scenes. You can also help control troubling or intruding thoughts by keeping your mind occupied with boring or repetitive thoughts like the old concept of counting sheep. You can make it more creative like imagining planting one beautiful flower after another in your backyard garden.   During your day, work to eliminate stress. When this is not possible use some of the previous suggestions to help reduce the anxiety that accompanies stressful situations.  MAKE SURE YOU:   Understand these instructions.   Will watch your condition.   Will get help right away if you are not doing well or get worse.  Document Released: 01/14/2000 Document Revised: 01/05/2011 Document Reviewed: 02/13/2007 [facility] Patient Information 2012 [place], [place].

## 2011-12-20 ENCOUNTER — Ambulatory Visit: Payer: BC Managed Care – PPO | Admitting: Family Medicine

## 2011-12-21 ENCOUNTER — Ambulatory Visit (INDEPENDENT_AMBULATORY_CARE_PROVIDER_SITE_OTHER): Payer: BC Managed Care – PPO | Admitting: Family Medicine

## 2011-12-21 VITALS — BP 112/60 | HR 90 | Wt 268.0 lb

## 2011-12-21 DIAGNOSIS — E669 Obesity, unspecified: Secondary | ICD-10-CM

## 2011-12-21 DIAGNOSIS — G4700 Insomnia, unspecified: Secondary | ICD-10-CM

## 2011-12-21 DIAGNOSIS — G4730 Sleep apnea, unspecified: Secondary | ICD-10-CM

## 2011-12-21 MED ORDER — ZOLPIDEM TARTRATE 10 MG PO TABS: 10.0000 mg | ORAL_TABLET | Freq: Every evening | ORAL | Status: DC | PRN

## 2011-12-21 NOTE — Progress Notes (Signed)
  Subjective:    Patient ID: Thomas Blankenship, male    DOB: 09-15-1960, 51 y.o.   MRN: [id number]  HPI He is here for consult concerning sleep issues. He has been on sleep meds and placed on them by a psychiatrist. He now also has difficulty with sleep apnea and continues to use his CPAP. He would like to come off the Ambien. Review of the record also indicates he has gained weight. He recently did see a nutritionist. He also has decrease his physical activities.   Review of Systems     Objective:   Physical Exam Alert and in no distress otherwise not examined       Assessment & Plan:   1. Sleep apnea   2. Insomnia w/ sleep apnea   3. Obesity    I will get a CPAP readout on them. Also discussed insomnia with him in regard to exercise, proper sleeping habits and medications that might possibly interfere. Encouraged him to become more physically active and work on losing weight. We'll also renew his Ambien. Since he does have sleep apnea explained that sometimes sleep meds are necessary.

## 2011-12-21 NOTE — Patient Instructions (Signed)

## 2011-12-22 ENCOUNTER — Other Ambulatory Visit: Payer: Self-pay

## 2011-12-22 ENCOUNTER — Telehealth: Payer: Self-pay | Admitting: Family Medicine

## 2011-12-22 NOTE — Telephone Encounter (Signed)
Called in ambien 

## 2011-12-25 NOTE — Telephone Encounter (Signed)
Work with Thomas Blankenship a concerning how to handle this.

## 2011-12-25 NOTE — Telephone Encounter (Signed)
Pt called and requested to get a new cpap called and stated he would like to go with a new one

## 2011-12-25 NOTE — Telephone Encounter (Signed)
Called Shanda Bumps She said the only way to get down load is for pt to 1) buy a new cpap or 2) rent on for a month for 107.00   called pt home and cell # left word for him of opptions and to please call me back and let me know what he wants to do

## 2011-12-25 NOTE — Telephone Encounter (Signed)
Called Shanda Bumps to inform her he wanted a new CPAP and she is sending me a new referral Dr.lalonde will have to sign

## 2011-12-25 NOTE — Telephone Encounter (Signed)
Dr.Lalonde how do you what to handle this

## 2012-02-15 ENCOUNTER — Ambulatory Visit (INDEPENDENT_AMBULATORY_CARE_PROVIDER_SITE_OTHER): Payer: BC Managed Care – PPO | Admitting: Family Medicine

## 2012-02-15 VITALS — BP 116/78 | HR 93 | Wt 261.0 lb

## 2012-02-15 DIAGNOSIS — M7742 Metatarsalgia, left foot: Secondary | ICD-10-CM

## 2012-02-15 DIAGNOSIS — M7750 Other enthesopathy of unspecified foot: Secondary | ICD-10-CM

## 2012-02-15 NOTE — Patient Instructions (Signed)
Check out the [facility]

## 2012-02-15 NOTE — Progress Notes (Signed)
  Subjective:    Patient ID: Thomas Blankenship, male    DOB: 26-Oct-1960, 52 y.o.   MRN: [id number]  HPI He continues to complain of left second metatarsal pain. He has tried over-the-counter orthotics with little success.   Review of Systems     Objective:   Physical Exam Exam of the left foot does show tenderness to palpation over the head of the second metatarsal but no other lesions noted.       Assessment & Plan:   1. Metatarsalgia of left foot    recommend he try more form fitting orthotics from the [facility] store. If he is no benefit from this, I will refer him to podiatry. Discussed  orthopedic referral but at this time do not feel that it is necessary.

## 2012-04-29 ENCOUNTER — Other Ambulatory Visit: Payer: Self-pay | Admitting: Family Medicine

## 2012-06-03 ENCOUNTER — Other Ambulatory Visit: Payer: Self-pay | Admitting: Family Medicine

## 2012-06-23 ENCOUNTER — Other Ambulatory Visit: Payer: Self-pay | Admitting: Family Medicine

## 2012-06-25 NOTE — Telephone Encounter (Signed)
IS THIS OK 

## 2012-06-25 NOTE — Telephone Encounter (Signed)
Okay to renew

## 2012-07-04 ENCOUNTER — Telehealth: Payer: Self-pay | Admitting: Internal Medicine

## 2012-07-04 ENCOUNTER — Other Ambulatory Visit: Payer: Self-pay | Admitting: Family Medicine

## 2012-07-04 NOTE — Telephone Encounter (Signed)
Is this ok?

## 2012-07-04 NOTE — Telephone Encounter (Signed)
Refill request for venlafaxine ER 150mg  #60 to [facility]

## 2012-07-05 MED ORDER — VENLAFAXINE HCL ER 150 MG PO CP24: ORAL_CAPSULE | ORAL | Status: DC

## 2012-07-05 NOTE — Telephone Encounter (Signed)
Don't let him run out but schedule him for an appt

## 2012-07-05 NOTE — Telephone Encounter (Signed)
I called and I left a message for the patient to callback to set up an appointment. CLS

## 2012-07-10 ENCOUNTER — Encounter: Payer: Self-pay | Admitting: Family Medicine

## 2012-07-10 ENCOUNTER — Ambulatory Visit (INDEPENDENT_AMBULATORY_CARE_PROVIDER_SITE_OTHER): Payer: BC Managed Care – PPO | Admitting: Family Medicine

## 2012-07-10 VITALS — BP 124/76 | HR 96 | Wt 265.0 lb

## 2012-07-10 DIAGNOSIS — Z79899 Other long term (current) drug therapy: Secondary | ICD-10-CM

## 2012-07-10 DIAGNOSIS — G4730 Sleep apnea, unspecified: Secondary | ICD-10-CM

## 2012-07-10 DIAGNOSIS — E669 Obesity, unspecified: Secondary | ICD-10-CM

## 2012-07-10 DIAGNOSIS — I10 Essential (primary) hypertension: Secondary | ICD-10-CM

## 2012-07-10 DIAGNOSIS — Z125 Encounter for screening for malignant neoplasm of prostate: Secondary | ICD-10-CM

## 2012-07-10 DIAGNOSIS — E291 Testicular hypofunction: Secondary | ICD-10-CM

## 2012-07-10 LAB — CBC WITH DIFFERENTIAL/PLATELET
Eosinophils Relative: 3 % (ref 0–5)
HCT: 46.8 % (ref 39.0–52.0)
Hemoglobin: 15.7 g/dL (ref 13.0–17.0)
Lymphocytes Relative: 26 % (ref 12–46)
MCV: 86.8 fL (ref 78.0–100.0)
Monocytes Absolute: 0.8 10*3/uL (ref 0.1–1.0)
Monocytes Relative: 11 % (ref 3–12)
Neutro Abs: 4 10*3/uL (ref 1.7–7.7)
WBC: 6.8 10*3/uL (ref 4.0–10.5)

## 2012-07-10 NOTE — Progress Notes (Signed)
  Subjective:    Patient ID: Thomas Blankenship, male    DOB: May 26, 1960, 52 y.o.   MRN: [id number]  HPI He is here for medication recheck. He continues on testosterone and is having no difficulties with this. He continues on Effexor and in counseling. He is working on self-esteem issues. His exercise has been cut back due to metatarsal pain. He recently got new shoes and apparently this is helping. He also has underlying sleep apnea. He has not had a read out stating his machine is quite old.   Review of Systems     Objective:   Physical Exam Alert and in no distress otherwise not examined       Assessment & Plan:  Hypertension  Hypogonadism male - Plan: CBC with Differential, PSA, Testosterone  Sleep apnea  Obesity  Encounter for long-term (current) use of other medications - Plan: CBC with Differential, PSA, Testosterone  Special screening for malignant neoplasm of prostate - Plan: PSA I will get routine blood screening on him. I will have him check with his CPAP company to see if he can possibly get another machine since it is very old. Encouraged him to get more physically active and consider other activities than just walking due to his foot discomfort. Also encouraged him to continue to work with his therapist in regard to self-esteem issues.

## 2012-07-11 LAB — TESTOSTERONE: Testosterone: 1403 ng/dL — ABNORMAL HIGH (ref 300–890)

## 2012-07-11 NOTE — Progress Notes (Signed)
Quick Note:  His testosterone level is quite elevated. Ask him what his dosing regimen is. He did supposed be 4 pumps per day and if he has been doing that, have it reduced to 3 and recheck here in one month ______

## 2012-07-12 ENCOUNTER — Telehealth: Payer: Self-pay | Admitting: Family Medicine

## 2012-07-15 NOTE — Progress Notes (Signed)
Quick Note:  CALLED PT CELL/HOME # left message word for word His testosterone level is quite elevated. Ask him what his dosing regimen is. He did supposed be 4 pumps per day and if he has been doing that, have it reduced to 3 and recheck here in one month and if dosing is any different please call me back to let me know ______

## 2012-07-17 NOTE — Telephone Encounter (Signed)
Done

## 2012-07-25 ENCOUNTER — Other Ambulatory Visit: Payer: Self-pay | Admitting: Family Medicine

## 2012-07-26 ENCOUNTER — Other Ambulatory Visit: Payer: Self-pay | Admitting: Family Medicine

## 2012-07-26 NOTE — Telephone Encounter (Signed)
DR.LALONDE IS THIS OK TO REFILL

## 2012-07-26 NOTE — Telephone Encounter (Signed)
Go ahead and renew this with 5 refills. See if he is using his CPAP

## 2012-08-05 ENCOUNTER — Telehealth: Payer: Self-pay | Admitting: Family Medicine

## 2012-08-05 MED ORDER — VENLAFAXINE HCL ER 150 MG PO CP24: ORAL_CAPSULE | ORAL | Status: DC

## 2012-08-05 NOTE — Telephone Encounter (Signed)
Needs Effexor refill. Patient states he had to last month for refill but it was only filled for one month and he was questioning why he could not have refills on Rx     [facility]

## 2012-09-19 ENCOUNTER — Encounter: Payer: Self-pay | Admitting: Internal Medicine

## 2012-10-02 ENCOUNTER — Encounter: Payer: Self-pay | Admitting: Family Medicine

## 2012-10-02 ENCOUNTER — Ambulatory Visit (INDEPENDENT_AMBULATORY_CARE_PROVIDER_SITE_OTHER): Payer: BC Managed Care – PPO | Admitting: Family Medicine

## 2012-10-02 VITALS — BP 120/80 | HR 86 | Wt 261.0 lb

## 2012-10-02 DIAGNOSIS — G4700 Insomnia, unspecified: Secondary | ICD-10-CM

## 2012-10-02 DIAGNOSIS — Z23 Encounter for immunization: Secondary | ICD-10-CM

## 2012-10-02 DIAGNOSIS — E291 Testicular hypofunction: Secondary | ICD-10-CM

## 2012-10-02 DIAGNOSIS — Z79899 Other long term (current) drug therapy: Secondary | ICD-10-CM

## 2012-10-02 DIAGNOSIS — I10 Essential (primary) hypertension: Secondary | ICD-10-CM

## 2012-10-02 DIAGNOSIS — F341 Dysthymic disorder: Secondary | ICD-10-CM

## 2012-10-02 NOTE — Progress Notes (Signed)
  Subjective:    Patient ID: Thomas Blankenship, male    DOB: 1960-10-02, 52 y.o.   MRN: [id number]  HPI He is here for medication check. His last testosterone level was elevated and I therefore reduced his dosing regimen. He continues on his blood pressure medication as well as Effexor and does use Ambien which he uses his CPAP. He continues in counseling and states that he is making slow progress.  He does use it regularly and has not had a read out recently. 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 Allergies seem to be under good control.   Review of Systems     Objective:   Physical Exam Alert and in no distress otherwise not examined       Assessment & Plan:  Need for prophylactic vaccination and inoculation against influenza - Plan: Flu Vaccine QUAD 36+ mos PF IM (Fluarix)  Hypertension  Hypogonadism male - Plan: Testosterone  Encounter for long-term (current) use of other medications - Plan: HM COLONOSCOPY  Dysthymia  Insomnia w/ sleep apnea And she is now 52 he did agree to have a colonoscopy. He will continue on all his medications. I will get a CPAP readout.

## 2012-10-03 NOTE — Progress Notes (Signed)
Quick Note:  CALLED PT HOME/CELL# LEFT WORD FOR WORD MESSAGE Testosterone level looks great. Continue on present dosing regimen. ______

## 2012-10-06 ENCOUNTER — Other Ambulatory Visit: Payer: Self-pay | Admitting: Family Medicine

## 2012-10-07 ENCOUNTER — Other Ambulatory Visit: Payer: Self-pay | Admitting: Family Medicine

## 2012-10-10 ENCOUNTER — Other Ambulatory Visit: Payer: Self-pay

## 2012-10-10 MED ORDER — TESTOSTERONE 20.25 MG/1.25GM (1.62%) TD GEL: 4.0000 | Freq: Every day | TRANSDERMAL | Status: DC

## 2012-10-10 NOTE — Telephone Encounter (Signed)
CALLED IN 1.62% ANDROGEL MED CHANGE

## 2012-10-24 ENCOUNTER — Telehealth: Payer: Self-pay | Admitting: Family Medicine

## 2012-10-24 NOTE — Telephone Encounter (Signed)
lm

## 2012-10-29 ENCOUNTER — Telehealth: Payer: Self-pay | Admitting: Family Medicine

## 2012-10-31 ENCOUNTER — Telehealth: Payer: Self-pay | Admitting: Family Medicine

## 2012-10-31 NOTE — Telephone Encounter (Signed)
Thomas Blankenship from the [employer] program [phone] option 1 called and states Androgel 1.62% has been approved for 6 months which is the longest it can be approved, from Oct 30 2012 to April 30, 2013   Case [id number].  She will fax over confirmation

## 2012-11-05 NOTE — Telephone Encounter (Signed)
Lm

## 2012-11-09 ENCOUNTER — Other Ambulatory Visit: Payer: Self-pay | Admitting: Family Medicine

## 2012-11-26 ENCOUNTER — Other Ambulatory Visit: Payer: Self-pay | Admitting: Family Medicine

## 2013-01-09 ENCOUNTER — Ambulatory Visit (INDEPENDENT_AMBULATORY_CARE_PROVIDER_SITE_OTHER): Payer: BC Managed Care – PPO | Admitting: Family Medicine

## 2013-01-09 ENCOUNTER — Encounter: Payer: Self-pay | Admitting: Family Medicine

## 2013-01-09 VITALS — BP 122/80 | HR 90 | Ht 66.0 in | Wt 259.0 lb

## 2013-01-09 DIAGNOSIS — E291 Testicular hypofunction: Secondary | ICD-10-CM

## 2013-01-09 DIAGNOSIS — F341 Dysthymic disorder: Secondary | ICD-10-CM

## 2013-01-09 DIAGNOSIS — Z23 Encounter for immunization: Secondary | ICD-10-CM

## 2013-01-09 DIAGNOSIS — I10 Essential (primary) hypertension: Secondary | ICD-10-CM

## 2013-01-09 DIAGNOSIS — J309 Allergic rhinitis, unspecified: Secondary | ICD-10-CM

## 2013-01-09 DIAGNOSIS — E669 Obesity, unspecified: Secondary | ICD-10-CM

## 2013-01-09 DIAGNOSIS — G4700 Insomnia, unspecified: Secondary | ICD-10-CM

## 2013-01-09 DIAGNOSIS — G4730 Sleep apnea, unspecified: Secondary | ICD-10-CM

## 2013-01-09 DIAGNOSIS — Z0000 Encounter for general adult medical examination without abnormal findings: Secondary | ICD-10-CM

## 2013-01-09 LAB — POCT URINALYSIS DIPSTICK
Blood, UA: NEGATIVE
Ketones, UA: NEGATIVE
Nitrite, UA: NEGATIVE
Protein, UA: NEGATIVE
Spec Grav, UA: 1.02
pH, UA: 5

## 2013-01-09 NOTE — Progress Notes (Signed)
[facility] HAS CALLED TODAY LEFT MESSAGE FOR PT TO CALL THEM BACK

## 2013-01-09 NOTE — Progress Notes (Signed)
Teaching Physician: Sharlot Gowda, MD Dictated By: Judithann Graves  Subjective:  Thomas Blankenship is a 51 y.o. male who presents for his annual complete exam. He has no acute concerns today.  He has a history of hypertension and when he measures his blood pressures at home they tend to be approximately 120/80. He is taking his lisinopril-HCTZ for this and tolerating it well with no dry cough, signs or symptoms of orthostasis. Some congestion and rhinorrhea lately and he has a history of allergic rhinitis but he takes Montelukast, uses Fluticasone and Azelastine inhalers and is able to manage his symptoms with these to satisfactory effect. Has a history of hypogonadism and is using Androgel for this, reporting improved mood and decreased fatigue. Uses CPAP nightly for sleep apnea and his machine was last serviced well over a year ago. He now has no PND, excessive daytime sleepiness, excessive sleep onset latency. Has been trying to improve his diet for management of his obesity - trying to bring his lunch with him to work rather than eating at restaurants or buying fast food. Walks dog for exercise and gets about one mile in each day. 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 He continues with counseling and is slowly making progress.  Family and social histories were reviewed.     ROS negative except as in subjective.  Objective: Filed Vitals:   01/09/13 0930  BP: 122/80  Pulse: 90    Physical Exam:  BP 122/80  Pulse 90  Ht 5\' 6"  (1.676 m)  Wt 259 lb (117.482 kg)  BMI 41.82 kg/m2  SpO2 98%  General Appearance:    Alert, cooperative, no distress, appears stated age  Head:    Normocephalic, without obvious  abnormality, atraumatic  Eyes:    PERRL, conjunctiva/corneas clear, EOM's intact, fundi    benign  Ears:    Normal TM's and external ear canals  Nose:   Nares normal, mucosa normal, no drainage or sinus   tenderness  Throat:   Lips, mucosa, and tongue normal; teeth and gums normal  Neck:   Supple, no lymphadenopathy;  thyroid:  no   enlargement/tenderness/nodules; no carotid   bruit or JVD  Back:    Spine nontender, no curvature, ROM normal, no CVA     tenderness  Lungs:     Clear to auscultation bilaterally without wheezes, rales or     ronchi; respirations unlabored  Chest Wall:    No tenderness or deformity   Heart:    Regular rate and rhythm, S1 and S2 normal, no murmur, rub   or gallop  Breast Exam:    No chest wall tenderness, masses or gynecomastia  Abdomen:     Soft, non-tender, nondistended, normoactive bowel sounds,    no masses, no hepatosplenomegaly  Extremities:   No clubbing, cyanosis or edema  Pulses:   2+ and symmetric all extremities  Skin:   Skin color, texture, turgor normal, no rashes or lesions  Lymph nodes:   Cervical, supraclavicular, and axillary nodes normal  Neurologic:   CNII-XII intact, normal strength, sensation and gait; reflexes 2+ and symmetric throughout          Psych:   Normal mood, affect, hygiene and  grooming.     Assessment and Plan: 1. Routine general medical examination at a health care facility - Ambulatory referral to Gastroenterology to arrange for screening colonoscopy  2. Hypertension Continue Lisinopril-HCTZ 20-12.5 mg capsule daily. - POCT Urinalysis Dipstick  3. Immunization due - Tdap vaccine greater than or equal to 7yo IM  4. Insomnia w/ sleep apnea Not presently symptomatic  5. Dysthymia Continuing counseling and his venlafaxine 150 mg capsule daily.   6. Allergic rhinitis, mild Able to manage his symptoms with his Singulair, Astelin, Flonase.  7. Sleep apnea Plans to have his CPAP machine serviced.  8.  Obesity Implementing dietary changes, preparing his own lunches rather than consuming fast food, and continuing his daily walking.  9. Hypogonadism male Will continue application of Androgel 1.62%.   Dr. Susann Givens was present for the encounter and agrees with the above assessment and plan.

## 2013-01-14 ENCOUNTER — Encounter: Payer: Self-pay | Admitting: Internal Medicine

## 2013-01-28 ENCOUNTER — Other Ambulatory Visit: Payer: Self-pay | Admitting: Family Medicine

## 2013-01-28 NOTE — Telephone Encounter (Signed)
IS THIS OKAY 

## 2013-01-28 NOTE — Telephone Encounter (Signed)
Ok to renew?  

## 2013-02-14 ENCOUNTER — Telehealth: Payer: Self-pay | Admitting: Internal Medicine

## 2013-02-14 MED ORDER — VENLAFAXINE HCL ER 150 MG PO CP24: ORAL_CAPSULE | ORAL | Status: DC

## 2013-02-14 NOTE — Telephone Encounter (Signed)
Refill request for venlafaxine ER 150mg  #60 to [facility]

## 2013-02-14 NOTE — Telephone Encounter (Signed)
RX sent to pharm 

## 2013-03-02 ENCOUNTER — Other Ambulatory Visit: Payer: Self-pay | Admitting: Family Medicine

## 2013-03-03 ENCOUNTER — Telehealth: Payer: Self-pay | Admitting: Internal Medicine

## 2013-03-03 NOTE — Telephone Encounter (Signed)
Last OV 01-09-13 Last filled 01-28-13 Is this okay to fill if so any refills?

## 2013-03-03 NOTE — Telephone Encounter (Signed)
error 

## 2013-03-03 NOTE — Telephone Encounter (Signed)
Pt wants multiple refills on his Thomas Blankenship so he dont have to call every month

## 2013-03-03 NOTE — Telephone Encounter (Signed)
Okay to renew

## 2013-03-25 ENCOUNTER — Encounter: Payer: BC Managed Care – PPO | Admitting: Internal Medicine

## 2013-04-03 ENCOUNTER — Other Ambulatory Visit: Payer: Self-pay | Admitting: Family Medicine

## 2013-04-04 ENCOUNTER — Other Ambulatory Visit: Payer: Self-pay | Admitting: Family Medicine

## 2013-04-04 NOTE — Telephone Encounter (Signed)
Medication sent in. 

## 2013-04-04 NOTE — Telephone Encounter (Signed)
Forwarding to you :)

## 2013-04-07 NOTE — Telephone Encounter (Signed)
Forwarding to you :)

## 2013-04-07 NOTE — Telephone Encounter (Signed)
Medication called in 

## 2013-05-16 ENCOUNTER — Other Ambulatory Visit: Payer: Self-pay | Admitting: Family Medicine

## 2013-05-16 ENCOUNTER — Telehealth: Payer: Self-pay | Admitting: Family Medicine

## 2013-05-16 MED ORDER — MONTELUKAST SODIUM 10 MG PO TABS: 10.0000 mg | ORAL_TABLET | Freq: Every day | ORAL | Status: DC

## 2013-05-16 MED ORDER — LISINOPRIL-HYDROCHLOROTHIAZIDE 10-12.5 MG PO TABS: 1.0000 | ORAL_TABLET | Freq: Every day | ORAL | Status: DC

## 2013-05-16 NOTE — Telephone Encounter (Signed)
Medication sent in. 

## 2013-07-08 ENCOUNTER — Other Ambulatory Visit: Payer: Self-pay | Admitting: Family Medicine

## 2013-07-08 NOTE — Telephone Encounter (Signed)
Is this ok to refill?  

## 2013-07-08 NOTE — Telephone Encounter (Signed)
Medication sent in. 

## 2013-07-08 NOTE — Telephone Encounter (Signed)
Okay to renew with 5 refills

## 2013-07-15 ENCOUNTER — Other Ambulatory Visit: Payer: Self-pay | Admitting: Family Medicine

## 2013-07-16 NOTE — Telephone Encounter (Signed)
Are these ok to refill? 

## 2013-08-05 ENCOUNTER — Other Ambulatory Visit: Payer: Self-pay | Admitting: Family Medicine

## 2013-08-05 NOTE — Telephone Encounter (Signed)
He needs to come in for a followup medication check but do not let him run out of his testosterone

## 2013-08-05 NOTE — Telephone Encounter (Signed)
IS THIS OKAY TO REFILL 

## 2013-08-08 ENCOUNTER — Telehealth: Payer: Self-pay | Admitting: Family Medicine

## 2013-08-12 NOTE — Telephone Encounter (Signed)
androgel pump approved 07/12/13-08/11/14. Pt was left a message that his med was approved and form of approval was faxed over to pharmacy for him to be able to get his med now

## 2013-08-16 ENCOUNTER — Other Ambulatory Visit: Payer: Self-pay | Admitting: Family Medicine

## 2013-09-16 ENCOUNTER — Other Ambulatory Visit: Payer: Self-pay | Admitting: Family Medicine

## 2013-09-25 ENCOUNTER — Encounter: Payer: Self-pay | Admitting: Family Medicine

## 2013-09-25 ENCOUNTER — Ambulatory Visit (INDEPENDENT_AMBULATORY_CARE_PROVIDER_SITE_OTHER): Payer: BC Managed Care – PPO | Admitting: Family Medicine

## 2013-09-25 VITALS — BP 116/76 | HR 84 | Wt 255.0 lb

## 2013-09-25 DIAGNOSIS — F341 Dysthymic disorder: Secondary | ICD-10-CM

## 2013-09-25 DIAGNOSIS — I10 Essential (primary) hypertension: Secondary | ICD-10-CM

## 2013-09-25 DIAGNOSIS — G4700 Insomnia, unspecified: Secondary | ICD-10-CM

## 2013-09-25 DIAGNOSIS — Z23 Encounter for immunization: Secondary | ICD-10-CM

## 2013-09-25 DIAGNOSIS — E669 Obesity, unspecified: Secondary | ICD-10-CM

## 2013-09-25 DIAGNOSIS — G4730 Sleep apnea, unspecified: Secondary | ICD-10-CM

## 2013-09-25 DIAGNOSIS — J309 Allergic rhinitis, unspecified: Secondary | ICD-10-CM

## 2013-09-25 DIAGNOSIS — E291 Testicular hypofunction: Secondary | ICD-10-CM

## 2013-09-25 LAB — CBC WITH DIFFERENTIAL/PLATELET
BASOS ABS: 0.1 10*3/uL (ref 0.0–0.1)
BASOS PCT: 1 % (ref 0–1)
EOS PCT: 2 % (ref 0–5)
Eosinophils Absolute: 0.1 10*3/uL (ref 0.0–0.7)
HEMATOCRIT: 49.5 % (ref 39.0–52.0)
Hemoglobin: 16.8 g/dL (ref 13.0–17.0)
Lymphocytes Relative: 28 % (ref 12–46)
Lymphs Abs: 1.8 10*3/uL (ref 0.7–4.0)
MCH: 29.4 pg (ref 26.0–34.0)
MCHC: 33.9 g/dL (ref 30.0–36.0)
MCV: 86.7 fL (ref 78.0–100.0)
MONO ABS: 0.6 10*3/uL (ref 0.1–1.0)
Monocytes Relative: 10 % (ref 3–12)
NEUTROS ABS: 3.7 10*3/uL (ref 1.7–7.7)
Neutrophils Relative %: 59 % (ref 43–77)
Platelets: 284 10*3/uL (ref 150–400)
RBC: 5.71 MIL/uL (ref 4.22–5.81)
RDW: 14.2 % (ref 11.5–15.5)
WBC: 6.3 10*3/uL (ref 4.0–10.5)

## 2013-09-25 LAB — COMPREHENSIVE METABOLIC PANEL
ALK PHOS: 79 U/L (ref 39–117)
ALT: 21 U/L (ref 0–53)
AST: 19 U/L (ref 0–37)
Albumin: 4.2 g/dL (ref 3.5–5.2)
BILIRUBIN TOTAL: 0.7 mg/dL (ref 0.2–1.2)
BUN: 14 mg/dL (ref 6–23)
CO2: 29 mEq/L (ref 19–32)
CREATININE: 1.09 mg/dL (ref 0.50–1.35)
Calcium: 9.4 mg/dL (ref 8.4–10.5)
Chloride: 100 mEq/L (ref 96–112)
Glucose, Bld: 82 mg/dL (ref 70–99)
Potassium: 4.8 mEq/L (ref 3.5–5.3)
Sodium: 139 mEq/L (ref 135–145)
Total Protein: 6.8 g/dL (ref 6.0–8.3)

## 2013-09-25 LAB — LIPID PANEL
CHOL/HDL RATIO: 6 ratio
Cholesterol: 198 mg/dL (ref 0–200)
HDL: 33 mg/dL — AB (ref >39)
LDL CALC: 114 mg/dL — AB (ref 0–99)
Triglycerides: 255 mg/dL — ABNORMAL HIGH (ref <150)
VLDL: 51 mg/dL — AB (ref 0–40)

## 2013-09-25 MED ORDER — MONTELUKAST SODIUM 10 MG PO TABS: ORAL_TABLET | ORAL | Status: DC

## 2013-09-25 MED ORDER — TESTOSTERONE 20.25 MG/1.25GM (1.62%) TD GEL: TRANSDERMAL | Status: DC

## 2013-09-25 MED ORDER — VENLAFAXINE HCL ER 150 MG PO CP24: ORAL_CAPSULE | ORAL | Status: DC

## 2013-09-25 MED ORDER — LISINOPRIL-HYDROCHLOROTHIAZIDE 10-12.5 MG PO TABS: ORAL_TABLET | ORAL | Status: DC

## 2013-09-25 NOTE — Progress Notes (Signed)
   Subjective:    Patient ID: Thomas Blankenship, male    DOB: 1960-10-29, 53 y.o.   MRN: [id number]  HPI He is here for medication recheck. He continues on testosterone and is having no difficulty with this. He also wants to continue on Effexor recognizing that it does help him with his mood. His allergies are under good control. He is using Singulair and OTC medications. He does have sleep apnea however cannot get a read out due to the age of his machine. He also does use Ambien to help with sleep due to his CPAP machine. His weight is unchanged. Work continues to go well. 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   Review of Systems     Objective:   Physical Exam alert and in no distress. Tympanic membranes and canals are normal. Throat is clear. Tonsils are normal. Neck is supple without adenopathy or thyromegaly. Cardiac exam shows a regular sinus rhythm without murmurs or gallops. Lungs are clear to auscultation.        Assessment & Plan:  Insomnia w/ sleep apnea  Dysthymia - Plan: venlafaxine XR (EFFEXOR-XR) 150 MG 24 hr capsule  Allergic rhinitis, mild - Plan: montelukast (SINGULAIR) 10 MG tablet  Essential hypertension - Plan: lisinopril-hydrochlorothiazide (PRINZIDE,ZESTORETIC) 10-12.5 MG per tablet, CBC with Differential, Comprehensive metabolic panel, Lipid panel  Sleep apnea  Hypogonadism male - Plan: Testosterone (ANDROGEL) 20.25 MG/1.25GM (1.62%) GEL, Testosterone, PSA, CBC with Differential, Comprehensive metabolic panel  Need for prophylactic vaccination and inoculation against influenza - Plan: Flu Vaccine QUAD 36+ mos IM  Obesity - Plan: CBC with Differential, Comprehensive metabolic panel, Lipid panel

## 2013-09-26 LAB — PSA: PSA: 0.55 ng/mL (ref <=4.00)

## 2013-09-26 LAB — TESTOSTERONE: Testosterone: 834 ng/dL (ref 300–890)

## 2013-09-29 ENCOUNTER — Other Ambulatory Visit: Payer: Self-pay

## 2013-09-29 DIAGNOSIS — E291 Testicular hypofunction: Secondary | ICD-10-CM

## 2013-09-29 MED ORDER — TESTOSTERONE 20.25 MG/1.25GM (1.62%) TD GEL: TRANSDERMAL | Status: DC

## 2013-09-29 NOTE — Telephone Encounter (Signed)
Called in testosterone with 5 refills

## 2014-01-08 ENCOUNTER — Other Ambulatory Visit: Payer: Self-pay

## 2014-01-08 ENCOUNTER — Other Ambulatory Visit: Payer: Self-pay | Admitting: Family Medicine

## 2014-01-08 NOTE — Telephone Encounter (Signed)
Is this okay?

## 2014-01-08 NOTE — Telephone Encounter (Signed)
Called in ambien 

## 2014-07-22 ENCOUNTER — Other Ambulatory Visit: Payer: Self-pay | Admitting: Family Medicine

## 2014-07-23 ENCOUNTER — Other Ambulatory Visit: Payer: Self-pay

## 2014-07-23 NOTE — Telephone Encounter (Signed)
Okay to renew

## 2014-07-23 NOTE — Telephone Encounter (Signed)
Is this okay?

## 2014-08-14 ENCOUNTER — Other Ambulatory Visit: Payer: Self-pay | Admitting: Family Medicine

## 2014-08-14 NOTE — Telephone Encounter (Signed)
Pt was scheduled and appt next week and i have called out his med

## 2014-08-14 NOTE — Telephone Encounter (Signed)
Is this okay to refill? 

## 2014-08-20 ENCOUNTER — Ambulatory Visit (INDEPENDENT_AMBULATORY_CARE_PROVIDER_SITE_OTHER): Payer: BC Managed Care – PPO | Admitting: Family Medicine

## 2014-08-20 ENCOUNTER — Other Ambulatory Visit: Payer: Self-pay | Admitting: Family Medicine

## 2014-08-20 ENCOUNTER — Encounter: Payer: Self-pay | Admitting: Family Medicine

## 2014-08-20 VITALS — BP 110/70 | HR 97 | Ht 66.0 in | Wt 249.0 lb

## 2014-08-20 DIAGNOSIS — Z7189 Other specified counseling: Secondary | ICD-10-CM

## 2014-08-20 DIAGNOSIS — J309 Allergic rhinitis, unspecified: Secondary | ICD-10-CM

## 2014-08-20 DIAGNOSIS — Z719 Counseling, unspecified: Secondary | ICD-10-CM | POA: Diagnosis not present

## 2014-08-20 DIAGNOSIS — I10 Essential (primary) hypertension: Secondary | ICD-10-CM

## 2014-08-20 DIAGNOSIS — E291 Testicular hypofunction: Secondary | ICD-10-CM

## 2014-08-20 DIAGNOSIS — G4730 Sleep apnea, unspecified: Secondary | ICD-10-CM

## 2014-08-20 DIAGNOSIS — E669 Obesity, unspecified: Secondary | ICD-10-CM | POA: Diagnosis not present

## 2014-08-20 DIAGNOSIS — F341 Dysthymic disorder: Secondary | ICD-10-CM

## 2014-08-20 DIAGNOSIS — G4700 Insomnia, unspecified: Secondary | ICD-10-CM

## 2014-08-20 DIAGNOSIS — Z1211 Encounter for screening for malignant neoplasm of colon: Secondary | ICD-10-CM

## 2014-08-20 LAB — COMPREHENSIVE METABOLIC PANEL
ALBUMIN: 3.7 g/dL (ref 3.5–5.2)
ALT: 18 U/L (ref 0–53)
AST: 17 U/L (ref 0–37)
Alkaline Phosphatase: 85 U/L (ref 39–117)
BILIRUBIN TOTAL: 0.5 mg/dL (ref 0.2–1.2)
BUN: 22 mg/dL (ref 6–23)
CO2: 30 mEq/L (ref 19–32)
CREATININE: 1.08 mg/dL (ref 0.50–1.35)
Calcium: 9.5 mg/dL (ref 8.4–10.5)
Chloride: 101 mEq/L (ref 96–112)
Glucose, Bld: 82 mg/dL (ref 70–99)
POTASSIUM: 4.4 meq/L (ref 3.5–5.3)
Sodium: 143 mEq/L (ref 135–145)
TOTAL PROTEIN: 7 g/dL (ref 6.0–8.3)

## 2014-08-20 LAB — CBC WITH DIFFERENTIAL/PLATELET
BASOS ABS: 0 10*3/uL (ref 0.0–0.1)
BASOS PCT: 0 % (ref 0–1)
Eosinophils Absolute: 0.3 10*3/uL (ref 0.0–0.7)
Eosinophils Relative: 4 % (ref 0–5)
HEMATOCRIT: 47.1 % (ref 39.0–52.0)
HEMOGLOBIN: 16.3 g/dL (ref 13.0–17.0)
Lymphocytes Relative: 23 % (ref 12–46)
Lymphs Abs: 1.8 10*3/uL (ref 0.7–4.0)
MCH: 29.5 pg (ref 26.0–34.0)
MCHC: 34.6 g/dL (ref 30.0–36.0)
MCV: 85.3 fL (ref 78.0–100.0)
MONO ABS: 0.9 10*3/uL (ref 0.1–1.0)
MPV: 8.5 fL — ABNORMAL LOW (ref 8.6–12.4)
Monocytes Relative: 11 % (ref 3–12)
NEUTROS ABS: 5 10*3/uL (ref 1.7–7.7)
Neutrophils Relative %: 62 % (ref 43–77)
PLATELETS: 262 10*3/uL (ref 150–400)
RBC: 5.52 MIL/uL (ref 4.22–5.81)
RDW: 14.1 % (ref 11.5–15.5)
WBC: 8 10*3/uL (ref 4.0–10.5)

## 2014-08-20 LAB — LIPID PANEL
CHOL/HDL RATIO: 5 ratio
CHOLESTEROL: 201 mg/dL — AB (ref 0–200)
HDL: 40 mg/dL (ref >=40)
LDL CALC: 108 mg/dL — AB (ref 0–99)
TRIGLYCERIDES: 265 mg/dL — AB (ref <150)
VLDL: 53 mg/dL — ABNORMAL HIGH (ref 0–40)

## 2014-08-20 MED ORDER — AZELASTINE HCL 0.1 % NA SOLN: NASAL | Status: DC

## 2014-08-20 MED ORDER — ZOLPIDEM TARTRATE 10 MG PO TABS: 10.0000 mg | ORAL_TABLET | Freq: Every evening | ORAL | Status: DC | PRN

## 2014-08-20 MED ORDER — VENLAFAXINE HCL ER 150 MG PO CP24: ORAL_CAPSULE | ORAL | Status: DC

## 2014-08-20 MED ORDER — LISINOPRIL-HYDROCHLOROTHIAZIDE 10-12.5 MG PO TABS: ORAL_TABLET | ORAL | Status: DC

## 2014-08-20 MED ORDER — MONTELUKAST SODIUM 10 MG PO TABS: ORAL_TABLET | ORAL | Status: DC

## 2014-08-20 NOTE — Progress Notes (Signed)
   Subjective:    Patient ID: Thomas Blankenship, male    DOB: Apr 05, 1960, 54 y.o.   MRN: [id number]  HPI He is here for medication check. He does have underlying sleep apnea however has not had a read out in quite some time. He states that his machine is over 54 years old. He does not complain of fatigue, weakness, all in his sleep. He also has underlying hypertension and is on medications for this and having no difficulty. He does continue on testosterone replacement and again has had no difficulty with libido, stamina, strength. He continues on Effexor and is doing quite nicely on this. His father died recently. He in the.being the primary caregiver and in fact moved him back to [place] prior to his death. This occurred in 2022-05-08. Since then he has done fairly well but they're still trying to settle the estate. His brother is now living with him. He is unemployed. His allergies are under good control with Astelin and Singulair. He continues to work and does not plan on retiring anytime soon. Social and family history was otherwise reviewed as well as immunizations and health maintenance.   Review of Systems  All other systems reviewed and are negative.      Objective:   Physical Exam Alert and in no distress. Tympanic membranes and canals are normal. Pharyngeal area is normal. Neck is supple without adenopathy or thyromegaly. Cardiac exam shows a regular sinus rhythm without murmurs or gallops. Lungs are clear to auscultation. Abdominal exam shows no masses or tenderness.        Assessment & Plan:  Insomnia w/ sleep apnea  Sleep apnea - Plan: zolpidem (AMBIEN) 10 MG tablet  Essential hypertension - Plan: lisinopril-hydrochlorothiazide (PRINZIDE,ZESTORETIC) 10-12.5 MG per tablet, CBC with Differential/Platelet, Comprehensive metabolic panel  Hypogonadism male - Plan: PSA, Testosterone  Obesity - Plan: CBC with Differential/Platelet, Comprehensive metabolic panel, Lipid  panel  Dysthymia - Plan: venlafaxine XR (EFFEXOR-XR) 150 MG 24 hr capsule  Allergic rhinitis, mild - Plan: azelastine (ASTELIN) 0.1 % nasal spray, montelukast (SINGULAIR) 10 MG tablet  Special screening for malignant neoplasms, colon - Plan: Ambulatory referral to Gastroenterology  Bereavement counseling  his medications were renewed. I discussed retirement with him and he has no plans at the present time. Also discussed the fact that his brother is living with him and strongly encouraged that to change at the appropriate time. He also seems to be handling the death of his father fairly well. We talked at length concerning this and at this time I do not think counseling is necessary. He was given a prescription for CPAP machine supplies and will be set at auto titrate. Over 45 minutes spent, greater than 50% in counseling and coordination of care.

## 2014-08-21 LAB — PSA: PSA: 0.53 ng/mL (ref <=4.00)

## 2014-08-21 LAB — TESTOSTERONE: Testosterone: 127 ng/dL — ABNORMAL LOW (ref 300–890)

## 2014-08-27 ENCOUNTER — Telehealth: Payer: Self-pay | Admitting: Family Medicine

## 2014-08-27 NOTE — Telephone Encounter (Signed)
P.A. Approved til 08/27/15, pt informed, faxed pharmacy

## 2014-08-27 NOTE — Telephone Encounter (Signed)
Initiated P.A. Androgel

## 2014-09-25 ENCOUNTER — Encounter: Payer: Self-pay | Admitting: Family Medicine

## 2014-09-28 ENCOUNTER — Telehealth: Payer: Self-pay | Admitting: Family Medicine

## 2014-09-28 NOTE — Telephone Encounter (Signed)
Had to leave message

## 2014-09-28 NOTE — Telephone Encounter (Signed)
Left message okay to refill med pt lost

## 2014-09-28 NOTE — Telephone Encounter (Signed)
Ok

## 2014-09-28 NOTE — Telephone Encounter (Signed)
Rcvd note from pharmacy stating that patient lost Venlafaxine ER 150mg  #180 while moving & pharmacy wants to know if it is ok to fill this med early

## 2014-10-08 ENCOUNTER — Telehealth: Payer: Self-pay

## 2014-10-08 NOTE — Telephone Encounter (Signed)
I WILL CALL JAMES

## 2014-10-08 NOTE — Telephone Encounter (Signed)
Pt called stating [facility] sent [place] something on Aug. 2nd about getting a CPAP machine for him and hasn't heard anything back yet and wanted more info. He couldn't tell me what additional info they wanted.

## 2014-10-08 NOTE — Telephone Encounter (Signed)
Take care of this 

## 2014-10-12 NOTE — Telephone Encounter (Signed)
I have called pulm.they said they can get me chart tomorrow for pt last sleep study

## 2014-10-14 NOTE — Telephone Encounter (Signed)
Called and left message with medical records to find out status of sleep study from 2006

## 2014-10-22 NOTE — Telephone Encounter (Signed)
I HAVE FAXED REQUEST AGAIN [facility] SHOULD HAVE HIS ORIGINAL SLEEP STUDY WAITING TO HERE FROM THEM

## 2014-10-30 ENCOUNTER — Telehealth: Payer: Self-pay | Admitting: Family Medicine

## 2014-10-30 ENCOUNTER — Other Ambulatory Visit: Payer: Self-pay | Admitting: Family Medicine

## 2014-10-30 ENCOUNTER — Telehealth: Payer: Self-pay

## 2014-10-30 MED ORDER — TESTOSTERONE 20.25 MG/ACT (1.62%) TD GEL: TRANSDERMAL | Status: DC

## 2014-10-30 NOTE — Telephone Encounter (Signed)
Phoned in.

## 2014-10-30 NOTE — Telephone Encounter (Signed)
ERROR

## 2014-10-30 NOTE — Telephone Encounter (Signed)
Call in the testosterone. 

## 2014-10-30 NOTE — Telephone Encounter (Signed)
Pt called requesting  RX refill his testosterone, pt is coming in Monday for a testosterone lab check, [facility]. Wondering if you could go ahead and refll it

## 2014-11-02 ENCOUNTER — Other Ambulatory Visit: Payer: BC Managed Care – PPO

## 2014-11-02 DIAGNOSIS — E291 Testicular hypofunction: Secondary | ICD-10-CM

## 2014-11-03 ENCOUNTER — Other Ambulatory Visit: Payer: Self-pay

## 2014-11-03 ENCOUNTER — Other Ambulatory Visit: Payer: Self-pay | Admitting: Family Medicine

## 2014-11-03 DIAGNOSIS — E291 Testicular hypofunction: Secondary | ICD-10-CM

## 2014-11-03 DIAGNOSIS — R7989 Other specified abnormal findings of blood chemistry: Secondary | ICD-10-CM

## 2014-11-03 LAB — TESTOSTERONE: Testosterone: 167 ng/dL — ABNORMAL LOW (ref 300–890)

## 2014-11-03 MED ORDER — TESTOSTERONE 20.25 MG/ACT (1.62%) TD GEL: TRANSDERMAL | Status: DC

## 2014-12-04 ENCOUNTER — Other Ambulatory Visit: Payer: Self-pay

## 2014-12-04 ENCOUNTER — Telehealth: Payer: Self-pay | Admitting: Family Medicine

## 2014-12-04 MED ORDER — ZOLPIDEM TARTRATE ER 12.5 MG PO TBCR: 12.5000 mg | EXTENDED_RELEASE_TABLET | Freq: Every evening | ORAL | Status: DC | PRN

## 2014-12-04 NOTE — Telephone Encounter (Signed)
Go ahead and send this through and have lower work with getting it covered

## 2014-12-04 NOTE — Telephone Encounter (Signed)
Pt is on regular Ambien and wants to go back on Ambien extended release.  Michela Pitcher was on it in the past and it worked better.

## 2014-12-04 NOTE — Telephone Encounter (Signed)
Called in [place] 12.5 cr per [employer]

## 2014-12-10 ENCOUNTER — Other Ambulatory Visit (INDEPENDENT_AMBULATORY_CARE_PROVIDER_SITE_OTHER): Payer: BC Managed Care – PPO

## 2014-12-10 DIAGNOSIS — E291 Testicular hypofunction: Secondary | ICD-10-CM

## 2014-12-10 DIAGNOSIS — Z23 Encounter for immunization: Secondary | ICD-10-CM

## 2014-12-11 LAB — TESTOSTERONE: TESTOSTERONE: 1212 ng/dL — AB (ref 300–890)

## 2014-12-15 NOTE — Telephone Encounter (Signed)
Per pharmacy Ambien went thru for $12

## 2015-02-09 ENCOUNTER — Other Ambulatory Visit: Payer: Self-pay | Admitting: Family Medicine

## 2015-02-09 NOTE — Telephone Encounter (Signed)
Pt states went to pick up Androgel and cost was $969, didn't know if was P.A. or what. [facility] changed pharmacy provider, now [facility].  Androgel is going thru but pt's cost is $969, no rejection or P.A.  Called pt back & he is to call his insurance company and find out why cost is so much and what is preferred alternative.

## 2015-02-09 NOTE — Telephone Encounter (Signed)
Pt called back & states Androgel is Tier 3 and subject to $1250 deductible.  Axiron is Tier 1 Preferred medication and cost is only $30 a month. Pt wants to change to Axiron.  Can you switch ?

## 2015-02-09 NOTE — Telephone Encounter (Signed)
Go ahead and switch him. Have him come in in one month for recheck on his testosterone

## 2015-02-10 MED ORDER — TESTOSTERONE 30 MG/ACT TD SOLN: TRANSDERMAL | Status: DC

## 2015-02-10 NOTE — Telephone Encounter (Signed)
[facility] into [facility]. Patient aware.

## 2015-02-10 NOTE — Telephone Encounter (Signed)
Please verify axiron directions.

## 2015-02-10 NOTE — Telephone Encounter (Signed)
1 actuation in each axilla daily and recheck 1 month

## 2015-02-11 ENCOUNTER — Telehealth: Payer: Self-pay | Admitting: Family Medicine

## 2015-02-12 ENCOUNTER — Telehealth: Payer: Self-pay | Admitting: Family Medicine

## 2015-02-12 NOTE — Telephone Encounter (Signed)
Pt states Axiron not at pharmacy only the Androgel.  Called [facility] again & spoke with pharmacist Raquel Sarna and she will make sure it's filled ASAP

## 2015-02-12 NOTE — Telephone Encounter (Signed)
Androgel is Tier 3 and subject to $1250 deductible. Axiron is Tier 1 Preferred medication and cost is only $30 a month.

## 2015-02-20 ENCOUNTER — Telehealth: Payer: Self-pay | Admitting: Family Medicine

## 2015-02-20 NOTE — Telephone Encounter (Signed)
P.A. AXIRON 

## 2015-02-26 NOTE — Telephone Encounter (Signed)
P.A. Approved til 02/19/18, pt informed, called pharmacy & went thru for $30

## 2015-04-21 ENCOUNTER — Other Ambulatory Visit: Payer: Self-pay | Admitting: Family Medicine

## 2015-04-22 NOTE — Telephone Encounter (Signed)
Is this okay to call in? 

## 2015-04-22 NOTE — Telephone Encounter (Signed)
He needs a follow-up appointment but don't let him run out

## 2015-04-26 NOTE — Telephone Encounter (Signed)
Testosterone refilled, patient schedule appointment.

## 2015-05-11 ENCOUNTER — Ambulatory Visit (INDEPENDENT_AMBULATORY_CARE_PROVIDER_SITE_OTHER): Payer: BC Managed Care – PPO | Admitting: Family Medicine

## 2015-05-11 ENCOUNTER — Encounter: Payer: Self-pay | Admitting: Family Medicine

## 2015-05-11 VITALS — BP 120/76 | HR 90 | Ht 66.0 in | Wt 259.0 lb

## 2015-05-11 DIAGNOSIS — E291 Testicular hypofunction: Secondary | ICD-10-CM

## 2015-05-11 DIAGNOSIS — Z79899 Other long term (current) drug therapy: Secondary | ICD-10-CM

## 2015-05-11 NOTE — Progress Notes (Signed)
   Subjective:    Patient ID: Thomas Blankenship, male    DOB: 16-Oct-1960, 55 y.o.   MRN: [id number]  HPI Her for a recheck. He switched to  Axiron due to insurance coverage. He cannot note any difference in his energy or stamina.   Review of Systems     Objective:   Physical Exam Alert and in no distress otherwise not examined       Assessment & Plan:  Hypogonadism male - Plan: Testosterone  Encounter for long-term (current) use of medications - Plan: Testosterone adjust accordingly and have him come back in several months. Review of the record indicates he will need more blood work this summer.

## 2015-05-12 ENCOUNTER — Other Ambulatory Visit: Payer: Self-pay

## 2015-05-12 LAB — TESTOSTERONE: Testosterone: 344 ng/dL (ref 250–827)

## 2015-05-12 MED ORDER — TESTOSTERONE 30 MG/ACT TD SOLN: TRANSDERMAL | Status: DC

## 2015-05-24 ENCOUNTER — Other Ambulatory Visit: Payer: Self-pay | Admitting: Family Medicine

## 2015-06-10 ENCOUNTER — Other Ambulatory Visit: Payer: BC Managed Care – PPO

## 2015-06-10 DIAGNOSIS — R7989 Other specified abnormal findings of blood chemistry: Secondary | ICD-10-CM

## 2015-06-11 LAB — TESTOSTERONE: Testosterone: 1739 ng/dL — ABNORMAL HIGH (ref 250–827)

## 2015-06-14 ENCOUNTER — Telehealth: Payer: Self-pay | Admitting: Internal Medicine

## 2015-06-14 ENCOUNTER — Other Ambulatory Visit: Payer: Self-pay

## 2015-06-14 MED ORDER — ZOLPIDEM TARTRATE ER 12.5 MG PO TBCR: 12.5000 mg | EXTENDED_RELEASE_TABLET | Freq: Every evening | ORAL | Status: DC | PRN

## 2015-06-14 NOTE — Telephone Encounter (Signed)
Request refill of Ambien CR 12.5mg  called to [employer] [facility]. Thomas Blankenship

## 2015-06-14 NOTE — Telephone Encounter (Signed)
Called in med 

## 2015-06-14 NOTE — Telephone Encounter (Signed)
Okay to renew

## 2015-06-17 ENCOUNTER — Encounter: Payer: Self-pay | Admitting: Family Medicine

## 2015-06-17 ENCOUNTER — Ambulatory Visit (INDEPENDENT_AMBULATORY_CARE_PROVIDER_SITE_OTHER): Payer: BC Managed Care – PPO | Admitting: Family Medicine

## 2015-06-17 VITALS — BP 128/80 | HR 94 | Ht 66.0 in | Wt 260.0 lb

## 2015-06-17 DIAGNOSIS — E291 Testicular hypofunction: Secondary | ICD-10-CM | POA: Diagnosis not present

## 2015-06-17 DIAGNOSIS — G4730 Sleep apnea, unspecified: Secondary | ICD-10-CM

## 2015-06-17 NOTE — Progress Notes (Signed)
   Subjective:    Patient ID: Thomas Blankenship, male    DOB: Apr 11, 1960, 55 y.o.   MRN: [id number]  HPI  he is here for a follow-up. On his last visit his testosterone was slightly low and he recommended doubling his dosing. Follow-up blood work showed a very elevated testosterone. Further discussion with him indicated that he was using the previous dosing regimen on his shoulders and torso but not under the arms. He then switched under the arms with double dosing and received a higher blood level. He also has an underlying history of OSA and is doing well on the CPAP but has not had a readout.   Review of Systems     Objective:   Physical Exam  alert and in no distress otherwise not examined       Assessment & Plan:  Hypogonadism male  Sleep apnea  I will have him reduce the  testosterone dosing  21 application in each axilla. He is also to get a read up on his CPAP and return here within the next month or 2 for more thorough evaluation.

## 2015-06-19 ENCOUNTER — Telehealth: Payer: Self-pay | Admitting: Family Medicine

## 2015-07-01 NOTE — Telephone Encounter (Signed)
Received fax stating no P.A. Required.  Called [facility] [phone] & they state that no P.A. Required.  He has only been getting 15 at a time and doesn't know why.  She ran a test claim for next fill date 07/14/15 for #30 & it went thru.  Called pt & he states would only go thru #15 at a time.  I  informed that should be able to fill #30 on 07/14/15.  He is to call back if he has any issues.

## 2015-07-06 ENCOUNTER — Ambulatory Visit (INDEPENDENT_AMBULATORY_CARE_PROVIDER_SITE_OTHER): Payer: BC Managed Care – PPO | Admitting: Family Medicine

## 2015-07-06 ENCOUNTER — Encounter: Payer: Self-pay | Admitting: Family Medicine

## 2015-07-06 VITALS — BP 120/80 | HR 101 | Ht 66.0 in | Wt 258.8 lb

## 2015-07-06 DIAGNOSIS — Z0000 Encounter for general adult medical examination without abnormal findings: Secondary | ICD-10-CM | POA: Diagnosis not present

## 2015-07-06 DIAGNOSIS — G4700 Insomnia, unspecified: Secondary | ICD-10-CM

## 2015-07-06 DIAGNOSIS — Z1211 Encounter for screening for malignant neoplasm of colon: Secondary | ICD-10-CM

## 2015-07-06 DIAGNOSIS — E669 Obesity, unspecified: Secondary | ICD-10-CM | POA: Diagnosis not present

## 2015-07-06 DIAGNOSIS — I10 Essential (primary) hypertension: Secondary | ICD-10-CM | POA: Diagnosis not present

## 2015-07-06 DIAGNOSIS — J309 Allergic rhinitis, unspecified: Secondary | ICD-10-CM | POA: Diagnosis not present

## 2015-07-06 DIAGNOSIS — F341 Dysthymic disorder: Secondary | ICD-10-CM

## 2015-07-06 DIAGNOSIS — Z1159 Encounter for screening for other viral diseases: Secondary | ICD-10-CM | POA: Diagnosis not present

## 2015-07-06 DIAGNOSIS — G4730 Sleep apnea, unspecified: Secondary | ICD-10-CM | POA: Diagnosis not present

## 2015-07-06 DIAGNOSIS — E291 Testicular hypofunction: Secondary | ICD-10-CM

## 2015-07-06 LAB — COMPREHENSIVE METABOLIC PANEL
ALT: 15 U/L (ref 9–46)
AST: 14 U/L (ref 10–35)
Albumin: 3.8 g/dL (ref 3.6–5.1)
Alkaline Phosphatase: 73 U/L (ref 40–115)
BUN: 14 mg/dL (ref 7–25)
CALCIUM: 9.2 mg/dL (ref 8.6–10.3)
CO2: 30 mmol/L (ref 20–31)
Chloride: 99 mmol/L (ref 98–110)
Creat: 1.25 mg/dL (ref 0.70–1.33)
Glucose, Bld: 101 mg/dL — ABNORMAL HIGH (ref 65–99)
POTASSIUM: 4.1 mmol/L (ref 3.5–5.3)
Sodium: 140 mmol/L (ref 135–146)
Total Bilirubin: 0.4 mg/dL (ref 0.2–1.2)
Total Protein: 6.4 g/dL (ref 6.1–8.1)

## 2015-07-06 LAB — POCT URINALYSIS DIPSTICK
BILIRUBIN UA: NEGATIVE
Clarity, UA: NEGATIVE
Color, UA: NEGATIVE
GLUCOSE UA: NEGATIVE
KETONES UA: NEGATIVE
Leukocytes, UA: NEGATIVE
Nitrite, UA: NEGATIVE
Protein, UA: NEGATIVE
RBC UA: NEGATIVE
Urobilinogen, UA: NEGATIVE
pH, UA: 6

## 2015-07-06 LAB — LIPID PANEL
Cholesterol: 164 mg/dL (ref 125–200)
HDL: 32 mg/dL — ABNORMAL LOW (ref >=40)
LDL CALC: 100 mg/dL (ref <130)
TRIGLYCERIDES: 160 mg/dL — AB (ref <150)
Total CHOL/HDL Ratio: 5.1 Ratio — ABNORMAL HIGH (ref <=5.0)
VLDL: 32 mg/dL — ABNORMAL HIGH (ref <30)

## 2015-07-06 LAB — CBC WITH DIFFERENTIAL/PLATELET
BASOS PCT: 1 %
Basophils Absolute: 81 cells/uL (ref 0–200)
EOS ABS: 243 {cells}/uL (ref 15–500)
Eosinophils Relative: 3 %
HEMATOCRIT: 51.1 % — AB (ref 38.5–50.0)
Hemoglobin: 17.2 g/dL — ABNORMAL HIGH (ref 13.2–17.1)
LYMPHS PCT: 19 %
Lymphs Abs: 1539 cells/uL (ref 850–3900)
MCH: 30.1 pg (ref 27.0–33.0)
MCHC: 33.7 g/dL (ref 32.0–36.0)
MCV: 89.3 fL (ref 80.0–100.0)
MONO ABS: 810 {cells}/uL (ref 200–950)
MPV: 8.5 fL (ref 7.5–12.5)
Monocytes Relative: 10 %
NEUTROS PCT: 67 %
Neutro Abs: 5427 cells/uL (ref 1500–7800)
Platelets: 302 10*3/uL (ref 140–400)
RBC: 5.72 MIL/uL (ref 4.20–5.80)
RDW: 13.5 % (ref 11.0–15.0)
WBC: 8.1 10*3/uL (ref 4.0–10.5)

## 2015-07-06 MED ORDER — LISINOPRIL-HYDROCHLOROTHIAZIDE 10-12.5 MG PO TABS: ORAL_TABLET | ORAL | Status: DC

## 2015-07-06 MED ORDER — AZELASTINE HCL 0.1 % NA SOLN: NASAL | Status: DC

## 2015-07-06 MED ORDER — VENLAFAXINE HCL ER 150 MG PO CP24: ORAL_CAPSULE | ORAL | Status: DC

## 2015-07-06 MED ORDER — MONTELUKAST SODIUM 10 MG PO TABS: ORAL_TABLET | ORAL | Status: DC

## 2015-07-06 NOTE — Progress Notes (Signed)
Subjective:    Patient ID: Thomas Blankenship, male    DOB: [birth year], 55 y.o.   MRN: [id number]  HPI He is here for complete examination. He does have underlying OSA and notes difficulty with fatigue and sleeping. He is using Ambien regularly because of the CPAP. He has not had a repeat in quite some time. He continues on Axiron and recently decrease his dosing due to high testosterone levels. He continues in counseling with Marjie Skiff to deal with stress related issues as well as dealing with his brother who is now living with him but not working. His allergies are under good control. His weight is unchanged. He states he walks his dog 3 times per day for total of usually 45 minutes. He was scheduled in the past for colonoscopy but did not get it done. He has had no abdominal pain, chest discomfort, urinary symptoms.   Review of Systems  All other systems reviewed and are negative.      Objective:   Physical Exam BP 120/80 mmHg  Pulse 101  Ht 5\' 6"  (1.676 m)  Wt 258 lb 12.8 oz (117.391 kg)  BMI 41.79 kg/m2  General Appearance:    Alert, cooperative, no distress, appears stated age  Head:    Normocephalic, without obvious abnormality, atraumatic  Eyes:    PERRL, conjunctiva/corneas clear, EOM's intact, fundi    benign  Ears:    Normal TM's and external ear canals  Nose:   Nares normal, mucosa normal, no drainage or sinus   tenderness  Throat:   Lips, mucosa, and tongue normal; teeth and gums normal  Neck:   Supple, no lymphadenopathy;  thyroid:  no   enlargement/tenderness/nodules; no carotid   bruit or JVD  Back:    Spine nontender, no curvature, ROM normal, no CVA     tenderness  Lungs:     Clear to auscultation bilaterally without wheezes, rales or     ronchi; respirations unlabored  Chest Wall:    No tenderness or deformity   Heart:    Regular rate and rhythm, S1 and S2 normal, no murmur, rub   or gallop  Breast Exam:    No chest wall tenderness, masses or gynecomastia    Abdomen:     Soft, non-tender, nondistended, normoactive bowel sounds,    no masses, no hepatosplenomegaly  Genitalia:    Normal male external genitalia without lesions.  Testicles without masses.  No inguinal hernias.     Extremities:   No clubbing, cyanosis or edema  Pulses:   2+ and symmetric all extremities  Skin:   Skin color, texture, turgor normal, no rashes or lesions  Lymph nodes:   Cervical, supraclavicular, and axillary nodes normal  Neurologic:   CNII-XII intact, normal strength, sensation and gait; reflexes 2+ and symmetric throughout          Psych:   Normal mood, affect, hygiene and grooming.          Assessment & Plan:  Routine general medical examination at a health care facility - Plan: POCT Urinalysis Dipstick, CBC with Differential/Platelet, Comprehensive metabolic panel  Essential hypertension - Plan: CBC with Differential/Platelet, Comprehensive metabolic panel, lisinopril-hydrochlorothiazide (PRINZIDE,ZESTORETIC) 10-12.5 MG tablet, Lipid panel  Hypogonadism male - Plan: Testosterone, PSA  Sleep apnea  Obesity - Plan: CBC with Differential/Platelet, Comprehensive metabolic panel, Lipid panel  Dysthymia - Plan: venlafaxine XR (EFFEXOR-XR) 150 MG 24 hr capsule  Allergic rhinitis, mild - Plan: azelastine (ASTELIN) 0.1 % nasal spray, montelukast (SINGULAIR)  10 MG tablet  Insomnia w/ sleep apnea  Screening for colon cancer - Plan: Ambulatory referral to Gastroenterology  Need for hepatitis C screening test - Plan: Hepatitis C antibody He is to get a read out on his CPAP machine to help determine the fatigue. We will also do routine blood screening. Also discussed continuing counseling and dealing with the fact that he seems to be enabling his brother to not be responsible for his own life. He will continue on all his other medications. Gust the possibility of him making adjustments in his carbohydrates.

## 2015-07-07 LAB — TESTOSTERONE: TESTOSTERONE: 946 ng/dL — AB (ref 250–827)

## 2015-07-07 LAB — PSA: PSA: 1.09 ng/mL (ref <=4.00)

## 2015-07-07 LAB — HEPATITIS C ANTIBODY: HCV Ab: NEGATIVE

## 2015-07-08 ENCOUNTER — Telehealth: Payer: Self-pay | Admitting: Family Medicine

## 2015-07-08 NOTE — Telephone Encounter (Signed)
Pt called stating that he was asked to get a cpap download from [facility] however when pt called [facility] they asked him that our office would need to request the download. Called [facility] and requested the download today

## 2015-07-09 ENCOUNTER — Encounter: Payer: Self-pay | Admitting: Internal Medicine

## 2015-07-09 ENCOUNTER — Other Ambulatory Visit: Payer: Self-pay | Admitting: Family Medicine

## 2015-07-09 NOTE — Telephone Encounter (Signed)
Phoned in.

## 2015-07-09 NOTE — Telephone Encounter (Signed)
How many refills? 

## 2015-07-09 NOTE — Telephone Encounter (Signed)
Okay to renew

## 2015-08-09 ENCOUNTER — Encounter: Payer: Self-pay | Admitting: Gastroenterology

## 2015-10-01 HISTORY — PX: COLONOSCOPY: SHX174

## 2015-10-07 ENCOUNTER — Ambulatory Visit (AMBULATORY_SURGERY_CENTER): Payer: Self-pay | Admitting: *Deleted

## 2015-10-07 VITALS — Ht 66.0 in | Wt 258.0 lb

## 2015-10-07 DIAGNOSIS — Z1211 Encounter for screening for malignant neoplasm of colon: Secondary | ICD-10-CM

## 2015-10-07 MED ORDER — NA SULFATE-K SULFATE-MG SULF 17.5-3.13-1.6 GM/177ML PO SOLN: 1.0000 | Freq: Once | ORAL | 0 refills | Status: AC

## 2015-10-07 NOTE — Progress Notes (Signed)
No egg or soy allergy known to patient  No issues with past sedation with any surgeries  or procedures, no intubation problems  No diet pills per patient No home 02 use per patient  No blood thinners per patient  Pt denies issues with constipation  No A fib or A flutter   

## 2015-10-08 ENCOUNTER — Encounter: Payer: Self-pay | Admitting: Gastroenterology

## 2015-10-21 ENCOUNTER — Ambulatory Visit (AMBULATORY_SURGERY_CENTER): Payer: BC Managed Care – PPO | Admitting: Gastroenterology

## 2015-10-21 ENCOUNTER — Encounter: Payer: Self-pay | Admitting: Gastroenterology

## 2015-10-21 VITALS — BP 106/57 | HR 84 | Temp 97.5°F | Resp 20 | Ht 66.0 in | Wt 258.0 lb

## 2015-10-21 DIAGNOSIS — Z1211 Encounter for screening for malignant neoplasm of colon: Secondary | ICD-10-CM | POA: Diagnosis present

## 2015-10-21 DIAGNOSIS — D123 Benign neoplasm of transverse colon: Secondary | ICD-10-CM | POA: Diagnosis not present

## 2015-10-21 DIAGNOSIS — D125 Benign neoplasm of sigmoid colon: Secondary | ICD-10-CM

## 2015-10-21 DIAGNOSIS — K635 Polyp of colon: Secondary | ICD-10-CM

## 2015-10-21 MED ORDER — SODIUM CHLORIDE 0.9 % IV SOLN: 500.0000 mL | INTRAVENOUS | Status: AC

## 2015-10-21 NOTE — Progress Notes (Signed)
Called to room to assist during endoscopic procedure.  Patient ID and intended procedure confirmed with present staff. Received instructions for my participation in the procedure from the performing physician.  

## 2015-10-21 NOTE — Progress Notes (Signed)
A/ox3 pleased with MAC, report to Penny RN 

## 2015-10-21 NOTE — Patient Instructions (Signed)
YOU HAD AN ENDOSCOPIC PROCEDURE TODAY AT [facility]:   Refer to the procedure report that was given to you for any specific questions about what was found during the examination.  If the procedure report does not answer your questions, please call your gastroenterologist to clarify.  If you requested that your care partner not be given the details of your procedure findings, then the procedure report has been included in a sealed envelope for you to review at your convenience later.  YOU SHOULD EXPECT: Some feelings of bloating in the abdomen. Passage of more gas than usual.  Walking can help get rid of the air that was put into your GI tract during the procedure and reduce the bloating. If you had a lower endoscopy (such as a colonoscopy or flexible sigmoidoscopy) you may notice spotting of blood in your stool or on the toilet paper. If you underwent a bowel prep for your procedure, you may not have a normal bowel movement for a few days.  Please Note:  You might notice some irritation and congestion in your nose or some drainage.  This is from the oxygen used during your procedure.  There is no need for concern and it should clear up in a day or so.  SYMPTOMS TO REPORT IMMEDIATELY:   Following lower endoscopy (colonoscopy or flexible sigmoidoscopy):  Excessive amounts of blood in the stool  Significant tenderness or worsening of abdominal pains  Swelling of the abdomen that is new, acute  Fever of 100F or higher    For urgent or emergent issues, a gastroenterologist can be reached at any hour by calling [phone].   DIET:  We do recommend a small meal at first, but then you may proceed to your regular diet.  Drink plenty of fluids but you should avoid alcoholic beverages for 24 hours.  ACTIVITY:  You should plan to take it easy for the rest of today and you should NOT DRIVE or use heavy machinery until tomorrow (because of the sedation medicines used during the test).     FOLLOW UP: Our staff will call the number listed on your records the next business day following your procedure to check on you and address any questions or concerns that you may have regarding the information given to you following your procedure. If we do not reach you, we will leave a message.  However, if you are feeling well and you are not experiencing any problems, there is no need to return our call.  We will assume that you have returned to your regular daily activities without incident.  If any biopsies were taken you will be contacted by phone or by letter within the next 1-3 weeks.  Please call us at [phone] if you have not heard about the biopsies in 3 weeks.    SIGNATURES/CONFIDENTIALITY: You and/or your care partner have signed paperwork which will be entered into your electronic medical record.  These signatures attest to the fact that that the information above on your After Visit Summary has been reviewed and is understood.  Full responsibility of the confidentiality of this discharge information lies with you and/or your care-partner.    Information on polyps given to you today  NO ASPIRIN ,IBUPROFEN,NAPROXEN, OR OTHER NON STEROIDAL ANTI INFLAMMATORY PRODUCTS FOR 5 DAYS AFTER POLYP REMOVAL  AWAIT PATHOLOGY RESULTS

## 2015-10-21 NOTE — Op Note (Signed)
[facility] Patient Name: Thomas Blankenship Procedure Date: 10/21/2015 10:24 AM MRN: [id number] Endoscopist: Mallie Mussel L. Loletha Carrow , MD Age: 55 Referring MD:  Date of Birth: 08-01-1960 Gender: Male Account #: [id number] Procedure:                Colonoscopy Indications:              Screening for colorectal malignant neoplasm, This                            is the patient's first colonoscopy Medicines:                Monitored Anesthesia Care Procedure:                Pre-Anesthesia Assessment:                           - Prior to the procedure, a History and Physical                            was performed, and patient medications and                            allergies were reviewed. The patient's tolerance of                            previous anesthesia was also reviewed. The risks                            and benefits of the procedure and the sedation                            options and risks were discussed with the patient.                            All questions were answered, and informed consent                            was obtained. Prior Anticoagulants: The patient has                            taken no previous anticoagulant or antiplatelet                            agents. ASA Grade Assessment: III - A patient with                            severe systemic disease. After reviewing the risks                            and benefits, the patient was deemed in                            satisfactory condition to undergo the procedure.  After obtaining informed consent, the colonoscope                            was passed under direct vision. Throughout the                            procedure, the patient's blood pressure, pulse, and                            oxygen saturations were monitored continuously. The                            Model CF-HQ190L [id number]) scope was introduced                            through the anus and  advanced to the the cecum,                            identified by appendiceal orifice and ileocecal                            valve. The colonoscopy was performed without                            difficulty. The patient tolerated the procedure                            well. The quality of the bowel preparation was                            good. The ileocecal valve, appendiceal orifice, and                            rectum were photographed. The quality of the bowel                            preparation was evaluated using the BBPS [facility]                            Bowel Preparation Scale) with scores of: Right                            Colon = 2, Transverse Colon = 2 and Left Colon = 2.                            The total BBPS score equals 6. The bowel                            preparation used was SUPREP. Scope In: 10:31:31 AM Scope Out: 10:56:34 AM Scope Withdrawal Time: 0 hours 17 minutes 6 seconds  Total Procedure Duration: 0 hours 25 minutes 3 seconds  Findings:                 The perianal  and digital rectal examinations were                            normal.                           A 6 mm polyp was found in the proximal transverse                            colon. The polyp was pedunculated. The polyp was                            removed with a hot snare. Resection and retrieval                            were complete.                           A 4 mm polyp was found in the distal sigmoid colon.                            The polyp was sessile. The polyp was removed with a                            cold snare. Resection and retrieval were complete.                           The exam was otherwise without abnormality on                            direct and retroflexion views. Complications:            No immediate complications. Estimated Blood Loss:     Estimated blood loss: none. Impression:               - One 6 mm polyp in the proximal transverse colon,                             removed with a hot snare. Resected and retrieved.                           - One 4 mm polyp in the distal sigmoid colon,                            removed with a cold snare. Resected and retrieved.                           - The examination was otherwise normal on direct                            and retroflexion views. Recommendation:           - Patient has a contact number available for  emergencies. The signs and symptoms of potential                            delayed complications were discussed with the                            patient. Return to normal activities tomorrow.                            Written discharge instructions were provided to the                            patient.                           - Resume previous diet.                           - Continue present medications.                           - No aspirin, ibuprofen, naproxen, or other                            non-steroidal anti-inflammatory drugs for 5 days                            after polyp removal.                           - Await pathology results.                           - Repeat colonoscopy is recommended for                            surveillance. The colonoscopy date will be                            determined after pathology results from today's                            exam become available for review. Suyash Amory L. Loletha Carrow, MD 10/21/2015 11:00:23 AM This report has been signed electronically.

## 2015-10-22 ENCOUNTER — Telehealth: Payer: Self-pay

## 2015-10-22 NOTE — Telephone Encounter (Signed)
  Follow up Call-  Call back number 10/21/2015  Post procedure Call Back phone  # [phone]  Permission to leave phone message Yes  Some recent data might be hidden     Patient questions:  Do you have a fever, pain , or abdominal swelling? No. Pain Score  0 *  Have you tolerated food without any problems? Yes.    Have you been able to return to your normal activities? Yes.    Do you have any questions about your discharge instructions: Diet   No. Medications  No. Follow up visit  No.  Do you have questions or concerns about your Care? No.  Actions: * If pain score is 4 or above: No action needed, pain <4.

## 2015-10-22 NOTE — Telephone Encounter (Signed)
  Follow up Call-  Call back number 10/21/2015  Post procedure Call Back phone  # [phone]  Permission to leave phone message Yes  Some recent data might be hidden    Patient was called for follow up after his procedure on 10/21/2015. No answer at the number given for follow up phone call. A message was left on the answering machine.

## 2015-10-25 ENCOUNTER — Other Ambulatory Visit: Payer: Self-pay | Admitting: Family Medicine

## 2015-10-25 NOTE — Telephone Encounter (Signed)
Is this okay to refill? 

## 2015-10-25 NOTE — Telephone Encounter (Signed)
Renew Axiron for 3 months

## 2015-10-29 ENCOUNTER — Encounter: Payer: Self-pay | Admitting: Gastroenterology

## 2015-12-11 ENCOUNTER — Other Ambulatory Visit: Payer: Self-pay | Admitting: Family Medicine

## 2015-12-13 NOTE — Telephone Encounter (Signed)
Okay to renew

## 2015-12-13 NOTE — Telephone Encounter (Signed)
Called in per jcl 

## 2015-12-13 NOTE — Telephone Encounter (Signed)
Is this okay to refill? 

## 2016-02-14 NOTE — Telephone Encounter (Signed)
dt ?

## 2016-05-08 ENCOUNTER — Other Ambulatory Visit: Payer: Self-pay | Admitting: Family Medicine

## 2016-05-08 DIAGNOSIS — F341 Dysthymic disorder: Secondary | ICD-10-CM

## 2016-05-09 NOTE — Telephone Encounter (Signed)
He needs a follow-up appointment but don't let them run out

## 2016-05-09 NOTE — Telephone Encounter (Signed)
Is this okay to refill? 

## 2016-05-10 ENCOUNTER — Telehealth: Payer: Self-pay | Admitting: Family Medicine

## 2016-05-10 NOTE — Telephone Encounter (Signed)
Pt made a medcheck appt for Tuesday April the 17th so if you could refill his medicine for him

## 2016-05-10 NOTE — Telephone Encounter (Signed)
I have already refilled it

## 2016-05-16 ENCOUNTER — Encounter: Payer: BC Managed Care – PPO | Admitting: Family Medicine

## 2016-05-23 ENCOUNTER — Ambulatory Visit (INDEPENDENT_AMBULATORY_CARE_PROVIDER_SITE_OTHER): Payer: BC Managed Care – PPO | Admitting: Family Medicine

## 2016-05-23 ENCOUNTER — Encounter: Payer: Self-pay | Admitting: Family Medicine

## 2016-05-23 VITALS — BP 114/80 | HR 96 | Ht 66.0 in | Wt 252.0 lb

## 2016-05-23 DIAGNOSIS — L989 Disorder of the skin and subcutaneous tissue, unspecified: Secondary | ICD-10-CM

## 2016-05-23 DIAGNOSIS — J309 Allergic rhinitis, unspecified: Secondary | ICD-10-CM

## 2016-05-23 DIAGNOSIS — G4700 Insomnia, unspecified: Secondary | ICD-10-CM | POA: Diagnosis not present

## 2016-05-23 DIAGNOSIS — I10 Essential (primary) hypertension: Secondary | ICD-10-CM

## 2016-05-23 DIAGNOSIS — Z79899 Other long term (current) drug therapy: Secondary | ICD-10-CM

## 2016-05-23 DIAGNOSIS — F341 Dysthymic disorder: Secondary | ICD-10-CM | POA: Diagnosis not present

## 2016-05-23 DIAGNOSIS — G4730 Sleep apnea, unspecified: Secondary | ICD-10-CM

## 2016-05-23 DIAGNOSIS — E669 Obesity, unspecified: Secondary | ICD-10-CM | POA: Diagnosis not present

## 2016-05-23 DIAGNOSIS — E291 Testicular hypofunction: Secondary | ICD-10-CM | POA: Diagnosis not present

## 2016-05-23 LAB — COMPREHENSIVE METABOLIC PANEL
ALT: 17 U/L (ref 9–46)
AST: 18 U/L (ref 10–35)
Albumin: 4 g/dL (ref 3.6–5.1)
Alkaline Phosphatase: 85 U/L (ref 40–115)
BUN: 13 mg/dL (ref 7–25)
CHLORIDE: 99 mmol/L (ref 98–110)
CO2: 24 mmol/L (ref 20–31)
Calcium: 9.3 mg/dL (ref 8.6–10.3)
Creat: 1.41 mg/dL — ABNORMAL HIGH (ref 0.70–1.33)
Glucose, Bld: 114 mg/dL — ABNORMAL HIGH (ref 65–99)
POTASSIUM: 4.1 mmol/L (ref 3.5–5.3)
Sodium: 139 mmol/L (ref 135–146)
TOTAL PROTEIN: 6.7 g/dL (ref 6.1–8.1)
Total Bilirubin: 0.9 mg/dL (ref 0.2–1.2)

## 2016-05-23 LAB — CBC WITH DIFFERENTIAL/PLATELET
BASOS ABS: 96 {cells}/uL (ref 0–200)
Basophils Relative: 1 %
EOS ABS: 192 {cells}/uL (ref 15–500)
EOS PCT: 2 %
HCT: 56.3 % — ABNORMAL HIGH (ref 38.5–50.0)
Hemoglobin: 19.4 g/dL — ABNORMAL HIGH (ref 13.2–17.1)
LYMPHS PCT: 16 %
Lymphs Abs: 1536 cells/uL (ref 850–3900)
MCH: 30.6 pg (ref 27.0–33.0)
MCHC: 34.5 g/dL (ref 32.0–36.0)
MCV: 88.8 fL (ref 80.0–100.0)
MONOS PCT: 10 %
MPV: 8.5 fL (ref 7.5–12.5)
Monocytes Absolute: 960 cells/uL — ABNORMAL HIGH (ref 200–950)
NEUTROS PCT: 71 %
Neutro Abs: 6816 cells/uL (ref 1500–7800)
PLATELETS: 211 10*3/uL (ref 140–400)
RBC: 6.34 MIL/uL — ABNORMAL HIGH (ref 4.20–5.80)
RDW: 13.3 % (ref 11.0–15.0)
WBC: 9.6 10*3/uL (ref 4.0–10.5)

## 2016-05-23 LAB — LIPID PANEL
CHOL/HDL RATIO: 5.1 ratio — AB (ref <5.0)
CHOLESTEROL: 182 mg/dL (ref <200)
HDL: 36 mg/dL — ABNORMAL LOW (ref >40)
LDL CALC: 109 mg/dL — AB (ref <100)
Triglycerides: 184 mg/dL — ABNORMAL HIGH (ref <150)
VLDL: 37 mg/dL — AB (ref <30)

## 2016-05-23 LAB — PSA: PSA: 1.3 ng/mL (ref <=4.0)

## 2016-05-23 MED ORDER — AZELASTINE HCL 0.1 % NA SOLN: NASAL | 3 refills | Status: DC

## 2016-05-23 MED ORDER — VENLAFAXINE HCL ER 150 MG PO CP24: 150.0000 mg | ORAL_CAPSULE | Freq: Two times a day (BID) | ORAL | 1 refills | Status: DC

## 2016-05-23 MED ORDER — TESTOSTERONE 30 MG/ACT TD SOLN: TRANSDERMAL | 1 refills | Status: DC

## 2016-05-23 MED ORDER — ZOLPIDEM TARTRATE ER 12.5 MG PO TBCR: 12.5000 mg | EXTENDED_RELEASE_TABLET | Freq: Every evening | ORAL | 5 refills | Status: DC | PRN

## 2016-05-23 MED ORDER — MONTELUKAST SODIUM 10 MG PO TABS: ORAL_TABLET | ORAL | 3 refills | Status: DC

## 2016-05-23 MED ORDER — LISINOPRIL-HYDROCHLOROTHIAZIDE 10-12.5 MG PO TABS: ORAL_TABLET | ORAL | 3 refills | Status: DC

## 2016-05-23 NOTE — Progress Notes (Signed)
   Subjective:    Patient ID: Thomas Blankenship, male    DOB: 12-25-1960, 56 y.o.   MRN: [id number]  HPI He is here for an interval evaluation. He does have underlying hypogonadism and continues on ask Ron is having no difficulty with that. He continues on his lisinopril/HCTZ for his hypertension. He also has underlying allergies and is doing well on Astelin, Singulair and Flonase. He also has OSA and does use a CPAP that he thinks intermittently is useful. He does use Ambien to help with sleep related to this. Continues to do well on Effexor but presently is not involved in any counseling. He does have a lesion present on the left forehead area that needs to be evaluated. 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 His physical activity level is quite limited. He does not smoke or drink. He is not presently sexually active.   Review of Systems     Objective:   Physical Exam Alert and in no distress. Tympanic membranes and canals are normal. Pharyngeal area is normal. Neck is supple without adenopathy or thyromegaly. Cardiac exam shows a regular sinus rhythm without murmurs or gallops. Lungs are clear to auscultation. Abdominal exam shows no masses or tenderness with normal bowel sounds        Assessment & Plan:  Hypogonadism male - Plan: PSA, Testosterone  Essential hypertension - Plan: CBC with Differential/Platelet, Comprehensive metabolic panel  Sleep apnea, unspecified type  Obesity without serious comorbidity, unspecified classification, unspecified obesity type - Plan: CBC with Differential/Platelet, Comprehensive metabolic panel, Lipid panel  Allergic rhinitis, mild  Insomnia w/ sleep apnea  Encounter for long-term (current) use of high-risk medication - Plan: CBC with Differential/Platelet, Comprehensive metabolic panel, Lipid panel, PSA, Testosterone  Facial lesion - Plan: Ambulatory referral to Dermatology Is stable on the above diagnoses on the present medications. Will  follow-up with blood work. Recommend he get a CPAP readout suite and truly assess OSA more thoroughly.

## 2016-05-24 ENCOUNTER — Other Ambulatory Visit: Payer: Self-pay

## 2016-05-24 ENCOUNTER — Telehealth: Payer: Self-pay | Admitting: Family Medicine

## 2016-05-24 DIAGNOSIS — E291 Testicular hypofunction: Secondary | ICD-10-CM

## 2016-05-24 LAB — TESTOSTERONE: TESTOSTERONE: 1335 ng/dL — AB (ref 250–827)

## 2016-05-24 MED ORDER — TESTOSTERONE 30 MG/ACT TD SOLN: TRANSDERMAL | 5 refills | Status: DC

## 2016-05-24 NOTE — Telephone Encounter (Signed)
Called in axiron

## 2016-05-24 NOTE — Telephone Encounter (Signed)
I have called and fixed this

## 2016-05-24 NOTE — Telephone Encounter (Signed)
Pharmacy called & left message that quantity is not correct for directions left on machine.  Please call back t# [phone]

## 2016-06-17 ENCOUNTER — Other Ambulatory Visit: Payer: Self-pay | Admitting: Family Medicine

## 2016-06-17 DIAGNOSIS — G4730 Sleep apnea, unspecified: Principal | ICD-10-CM

## 2016-06-17 DIAGNOSIS — G4700 Insomnia, unspecified: Secondary | ICD-10-CM

## 2016-06-19 NOTE — Telephone Encounter (Signed)
ok 

## 2016-06-19 NOTE — Telephone Encounter (Signed)
Is this okay to refill? 

## 2016-06-19 NOTE — Telephone Encounter (Signed)
Called in ambien per jcl 

## 2016-09-19 ENCOUNTER — Other Ambulatory Visit: Payer: Self-pay | Admitting: Family Medicine

## 2016-09-19 DIAGNOSIS — G4700 Insomnia, unspecified: Secondary | ICD-10-CM

## 2016-09-19 DIAGNOSIS — G4730 Sleep apnea, unspecified: Principal | ICD-10-CM

## 2016-09-19 NOTE — Telephone Encounter (Signed)
Can pt have a refill on this 

## 2016-09-19 NOTE — Telephone Encounter (Signed)
ok 

## 2016-09-20 ENCOUNTER — Other Ambulatory Visit: Payer: Self-pay | Admitting: Family Medicine

## 2016-09-20 DIAGNOSIS — G4700 Insomnia, unspecified: Secondary | ICD-10-CM

## 2016-09-20 DIAGNOSIS — G4730 Sleep apnea, unspecified: Principal | ICD-10-CM

## 2016-09-20 NOTE — Telephone Encounter (Signed)
Called in.

## 2016-12-20 ENCOUNTER — Other Ambulatory Visit: Payer: Self-pay | Admitting: Family Medicine

## 2016-12-20 DIAGNOSIS — G4700 Insomnia, unspecified: Secondary | ICD-10-CM

## 2016-12-20 DIAGNOSIS — G4730 Sleep apnea, unspecified: Principal | ICD-10-CM

## 2016-12-20 NOTE — Telephone Encounter (Signed)
Ok to renew?  

## 2016-12-23 NOTE — Telephone Encounter (Signed)
ok 

## 2016-12-25 ENCOUNTER — Other Ambulatory Visit: Payer: Self-pay | Admitting: Family Medicine

## 2016-12-25 DIAGNOSIS — G4730 Sleep apnea, unspecified: Principal | ICD-10-CM

## 2016-12-25 DIAGNOSIS — G4700 Insomnia, unspecified: Secondary | ICD-10-CM

## 2016-12-25 NOTE — Telephone Encounter (Signed)
Sent to me by mistake 

## 2016-12-25 NOTE — Telephone Encounter (Signed)
Called in Ambien. Victorino December

## 2017-03-05 ENCOUNTER — Other Ambulatory Visit: Payer: Self-pay | Admitting: Family Medicine

## 2017-03-05 DIAGNOSIS — F341 Dysthymic disorder: Secondary | ICD-10-CM

## 2017-03-05 NOTE — Telephone Encounter (Signed)
Is this okay to refill? 

## 2017-03-05 NOTE — Telephone Encounter (Signed)
He needs an appointment

## 2017-03-26 ENCOUNTER — Other Ambulatory Visit: Payer: Self-pay | Admitting: Family Medicine

## 2017-03-26 DIAGNOSIS — G4730 Sleep apnea, unspecified: Principal | ICD-10-CM

## 2017-03-26 DIAGNOSIS — G4700 Insomnia, unspecified: Secondary | ICD-10-CM

## 2017-03-26 NOTE — Telephone Encounter (Signed)
He needs an appt 

## 2017-03-26 NOTE — Telephone Encounter (Signed)
Is this okay to refill? 

## 2017-03-27 NOTE — Telephone Encounter (Signed)
You can renew the testosterone but he needs to come in for an office visit.

## 2017-03-27 NOTE — Telephone Encounter (Signed)
Are these two med ok to fill. Pharmacy  is requesting them to be refilled [facility] [address]

## 2017-04-12 ENCOUNTER — Telehealth: Payer: Self-pay

## 2017-04-12 NOTE — Telephone Encounter (Signed)
Pharmacy is requesting his andro gel be filled with 2 boxes of 150 grams. Can we change the script to that. Please advise Thanks [facility]

## 2017-04-12 NOTE — Telephone Encounter (Signed)
ok 

## 2017-04-12 NOTE — Telephone Encounter (Signed)
Ok. Called in. [facility]

## 2017-04-14 ENCOUNTER — Other Ambulatory Visit: Payer: Self-pay | Admitting: Family Medicine

## 2017-04-14 DIAGNOSIS — G4700 Insomnia, unspecified: Secondary | ICD-10-CM

## 2017-04-14 DIAGNOSIS — G4730 Sleep apnea, unspecified: Principal | ICD-10-CM

## 2017-04-16 ENCOUNTER — Telehealth: Payer: Self-pay | Admitting: Family Medicine

## 2017-04-16 DIAGNOSIS — G4700 Insomnia, unspecified: Secondary | ICD-10-CM

## 2017-04-16 DIAGNOSIS — G4730 Sleep apnea, unspecified: Principal | ICD-10-CM

## 2017-04-16 MED ORDER — ZOLPIDEM TARTRATE ER 12.5 MG PO TBCR: 12.5000 mg | EXTENDED_RELEASE_TABLET | Freq: Every evening | ORAL | 1 refills | Status: DC | PRN

## 2017-04-16 NOTE — Telephone Encounter (Signed)
Please advise if Lorrin Mais can be filled for pt at [facility]. Thanks [employer]

## 2017-04-16 NOTE — Telephone Encounter (Signed)
Pt states doesn't know why Ambien Rx was changed to #15 says should have been #30.  Pt needs refill and he did set up appt for med check 05/04/17 was your first available.

## 2017-04-22 ENCOUNTER — Telehealth: Payer: Self-pay | Admitting: Family Medicine

## 2017-04-22 NOTE — Telephone Encounter (Signed)
P.A. ANDROGEL completed and approved

## 2017-05-04 ENCOUNTER — Encounter: Payer: Self-pay | Admitting: Family Medicine

## 2017-05-04 ENCOUNTER — Ambulatory Visit: Payer: BC Managed Care – PPO | Admitting: Family Medicine

## 2017-05-04 VITALS — BP 118/76 | HR 83 | Temp 98.5°F | Ht 66.0 in | Wt 238.6 lb

## 2017-05-04 DIAGNOSIS — I10 Essential (primary) hypertension: Secondary | ICD-10-CM

## 2017-05-04 DIAGNOSIS — E291 Testicular hypofunction: Secondary | ICD-10-CM

## 2017-05-04 DIAGNOSIS — D126 Benign neoplasm of colon, unspecified: Secondary | ICD-10-CM | POA: Insufficient documentation

## 2017-05-04 DIAGNOSIS — J309 Allergic rhinitis, unspecified: Secondary | ICD-10-CM

## 2017-05-04 DIAGNOSIS — G4700 Insomnia, unspecified: Secondary | ICD-10-CM

## 2017-05-04 DIAGNOSIS — F341 Dysthymic disorder: Secondary | ICD-10-CM

## 2017-05-04 DIAGNOSIS — E669 Obesity, unspecified: Secondary | ICD-10-CM

## 2017-05-04 DIAGNOSIS — G4730 Sleep apnea, unspecified: Secondary | ICD-10-CM

## 2017-05-04 DIAGNOSIS — Z125 Encounter for screening for malignant neoplasm of prostate: Secondary | ICD-10-CM

## 2017-05-04 MED ORDER — VENLAFAXINE HCL ER 150 MG PO CP24: 150.0000 mg | ORAL_CAPSULE | Freq: Two times a day (BID) | ORAL | 3 refills | Status: DC

## 2017-05-04 MED ORDER — MONTELUKAST SODIUM 10 MG PO TABS: ORAL_TABLET | ORAL | 3 refills | Status: DC

## 2017-05-04 MED ORDER — AZELASTINE HCL 0.1 % NA SOLN: NASAL | 3 refills | Status: DC

## 2017-05-04 MED ORDER — LISINOPRIL-HYDROCHLOROTHIAZIDE 10-12.5 MG PO TABS: ORAL_TABLET | ORAL | 3 refills | Status: DC

## 2017-05-04 NOTE — Progress Notes (Signed)
   Subjective:    Patient ID: Thomas Blankenship, male    DOB: Nov 23, 1960, 57 y.o.   MRN: [id number]  HPI He is here for an interval evaluation.  He has not been seen in over a year.  His allergies are under good control.  He continues to do quite nicely on his Effexor and would like to continue on that.  He is also using testosterone and feels that it does help with energy and strength.  He does have OSA and does use a CPAP.  He notes that it does up with energy and stamina.  He does use Ambien to help with that.  He does have a history of colonic polyp and is scheduled for repeat colonoscopy in 2022.  He has no other concerns or complaints.  Review of Systems     Objective:   Physical Exam Alert and in no distress. Tympanic membranes and canals are normal. Pharyngeal area is normal. Neck is supple without adenopathy or thyromegaly. Cardiac exam shows a regular sinus rhythm without murmurs or gallops. Lungs are clear to auscultation.        Assessment & Plan:  Allergic rhinitis, mild - Plan: azelastine (ASTELIN) 0.1 % nasal spray, montelukast (SINGULAIR) 10 MG tablet  Dysthymia - Plan: venlafaxine XR (EFFEXOR-XR) 150 MG 24 hr capsule  Essential hypertension - Plan: CBC with Differential/Platelet, Comprehensive metabolic panel, lisinopril-hydrochlorothiazide (PRINZIDE,ZESTORETIC) 10-12.5 MG tablet  Hypogonadism male - Plan: Testosterone  Insomnia w/ sleep apnea  Obesity without serious comorbidity, unspecified classification, unspecified obesity type - Plan: CBC with Differential/Platelet, Comprehensive metabolic panel, Lipid panel  Sleep apnea, unspecified type  Screening for prostate cancer - Plan: PSA  Adenomatous polyp of colon, unspecified part of colon His weight is down and I complemented him on that.  He will continue on his present allergy medication as well as Effexor.  He will continue on all of his other medications as well.  I will do routine blood screening on him.

## 2017-05-05 LAB — CBC WITH DIFFERENTIAL/PLATELET
BASOS ABS: 0 10*3/uL (ref 0.0–0.2)
BASOS: 1 %
EOS (ABSOLUTE): 0.2 10*3/uL (ref 0.0–0.4)
Eos: 4 %
Hematocrit: 51 % (ref 37.5–51.0)
Hemoglobin: 17.2 g/dL (ref 13.0–17.7)
IMMATURE GRANULOCYTES: 0 %
Immature Grans (Abs): 0 10*3/uL (ref 0.0–0.1)
Lymphocytes Absolute: 1.5 10*3/uL (ref 0.7–3.1)
Lymphs: 27 %
MCH: 30.6 pg (ref 26.6–33.0)
MCHC: 33.7 g/dL (ref 31.5–35.7)
MCV: 91 fL (ref 79–97)
MONOS ABS: 0.7 10*3/uL (ref 0.1–0.9)
Monocytes: 13 %
NEUTROS ABS: 3.1 10*3/uL (ref 1.4–7.0)
NEUTROS PCT: 55 %
Platelets: 255 10*3/uL (ref 150–379)
RBC: 5.63 x10E6/uL (ref 4.14–5.80)
RDW: 13.5 % (ref 12.3–15.4)
WBC: 5.6 10*3/uL (ref 3.4–10.8)

## 2017-05-05 LAB — COMPREHENSIVE METABOLIC PANEL
A/G RATIO: 1.6 (ref 1.2–2.2)
ALT: 19 IU/L (ref 0–44)
AST: 19 IU/L (ref 0–40)
Albumin: 4.1 g/dL (ref 3.5–5.5)
Alkaline Phosphatase: 86 IU/L (ref 39–117)
BILIRUBIN TOTAL: 0.5 mg/dL (ref 0.0–1.2)
BUN/Creatinine Ratio: 13 (ref 9–20)
BUN: 15 mg/dL (ref 6–24)
CHLORIDE: 101 mmol/L (ref 96–106)
CO2: 28 mmol/L (ref 20–29)
Calcium: 9.1 mg/dL (ref 8.7–10.2)
Creatinine, Ser: 1.18 mg/dL (ref 0.76–1.27)
GFR calc Af Amer: 80 mL/min/{1.73_m2} (ref >59)
GFR calc non Af Amer: 70 mL/min/{1.73_m2} (ref >59)
Globulin, Total: 2.5 g/dL (ref 1.5–4.5)
Glucose: 89 mg/dL (ref 65–99)
Potassium: 4.7 mmol/L (ref 3.5–5.2)
Sodium: 143 mmol/L (ref 134–144)
Total Protein: 6.6 g/dL (ref 6.0–8.5)

## 2017-05-05 LAB — TESTOSTERONE: Testosterone: 773 ng/dL (ref 264–916)

## 2017-05-05 LAB — LIPID PANEL
CHOLESTEROL TOTAL: 190 mg/dL (ref 100–199)
Chol/HDL Ratio: 4.6 ratio (ref 0.0–5.0)
HDL: 41 mg/dL (ref >39)
LDL Calculated: 111 mg/dL — ABNORMAL HIGH (ref 0–99)
TRIGLYCERIDES: 189 mg/dL — AB (ref 0–149)
VLDL Cholesterol Cal: 38 mg/dL (ref 5–40)

## 2017-05-05 LAB — PSA: Prostate Specific Ag, Serum: 1.7 ng/mL (ref 0.0–4.0)

## 2017-07-03 ENCOUNTER — Other Ambulatory Visit: Payer: Self-pay | Admitting: Family Medicine

## 2017-07-03 DIAGNOSIS — G4700 Insomnia, unspecified: Secondary | ICD-10-CM

## 2017-07-03 DIAGNOSIS — G4730 Sleep apnea, unspecified: Principal | ICD-10-CM

## 2017-07-03 NOTE — Telephone Encounter (Signed)
See if you can call this in.  I tried to E prescribe it but it did not work

## 2017-07-03 NOTE — Telephone Encounter (Signed)
[facility] would like to fill pt ambien . Please advise Kh

## 2017-07-04 NOTE — Telephone Encounter (Signed)
Called in med 

## 2017-07-04 NOTE — Telephone Encounter (Signed)
Done called kh

## 2017-09-04 ENCOUNTER — Other Ambulatory Visit: Payer: Self-pay | Admitting: Family Medicine

## 2017-09-04 DIAGNOSIS — G4700 Insomnia, unspecified: Secondary | ICD-10-CM

## 2017-09-04 DIAGNOSIS — G4730 Sleep apnea, unspecified: Principal | ICD-10-CM

## 2017-09-04 NOTE — Telephone Encounter (Signed)
[facility] is requesting to fill pt ambien. Please advise KH 

## 2017-10-18 ENCOUNTER — Encounter: Payer: Self-pay | Admitting: Family Medicine

## 2017-10-18 ENCOUNTER — Ambulatory Visit: Payer: BC Managed Care – PPO | Admitting: Family Medicine

## 2017-10-18 VITALS — BP 122/82 | HR 80 | Temp 98.1°F | Wt 246.8 lb

## 2017-10-18 DIAGNOSIS — E291 Testicular hypofunction: Secondary | ICD-10-CM

## 2017-10-18 DIAGNOSIS — M79661 Pain in right lower leg: Secondary | ICD-10-CM | POA: Diagnosis not present

## 2017-10-18 DIAGNOSIS — M79662 Pain in left lower leg: Secondary | ICD-10-CM

## 2017-10-18 DIAGNOSIS — F341 Dysthymic disorder: Secondary | ICD-10-CM

## 2017-10-18 DIAGNOSIS — G4730 Sleep apnea, unspecified: Secondary | ICD-10-CM

## 2017-10-18 DIAGNOSIS — I10 Essential (primary) hypertension: Secondary | ICD-10-CM | POA: Diagnosis not present

## 2017-10-18 DIAGNOSIS — Z23 Encounter for immunization: Secondary | ICD-10-CM | POA: Diagnosis not present

## 2017-10-18 DIAGNOSIS — G4700 Insomnia, unspecified: Secondary | ICD-10-CM

## 2017-10-18 NOTE — Progress Notes (Signed)
   Subjective:    Patient ID: Thomas Blankenship, male    DOB: 12/02/1960, 57 y.o.   MRN: [id number]  HPI He is here for evaluation of a several month history of bilateral calf pain.  He describes it in the outer aspect of both calves especially when he gets up in the morning.  He also notes that increased physical activity does bring this on.  No pain in any other areas of his body.  He has had no weakness, numbness or tingling no chest pain, shortness of breath.  He does not smoke.  He does have spring and fall allergies.  Does have underlying OSA and does tend to wake up tired.  He has not had a read out in well over a year.  He continues on testosterone.  He is also taking Effexor and states that this does help his mood.  He is on lisinopril/HCTZ.   Review of Systems     Objective:   Physical Exam Alert and in no distress.  Previous blood work was evaluated.  No palpable tenderness to the calves.  Negative Homans sign.  Pulses were 2+.  Skin is normal with good capillary refill.      Assessment & Plan:  Bilateral calf pain - Plan: VAS [place] LE ART SEG MULTI (Segm&LE Reynauds)  Dysthymia  Essential hypertension  Hypogonadism male  Insomnia w/ sleep apnea  Need for influenza vaccination - Plan: Flu Vaccine QUAD 36+ mos IM He is to get a read out for his OSA and we will adjust accordingly.  Continue on his other medications.  Follow-up pending results of ultrasound.

## 2017-10-23 ENCOUNTER — Telehealth: Payer: Self-pay

## 2017-10-23 NOTE — Telephone Encounter (Signed)
Called pt to advise of C PAP readings. Per Dr. Redmond School to continue . [place]

## 2017-10-25 ENCOUNTER — Ambulatory Visit (HOSPITAL_COMMUNITY)
Admission: RE | Admit: 2017-10-25 | Discharge: 2017-10-25 | Disposition: A | Discharge status: Home / Self Care | Payer: BC Managed Care – PPO | Source: Ambulatory Visit | Attending: Cardiology | Admitting: Cardiology

## 2017-10-25 DIAGNOSIS — M79662 Pain in left lower leg: Secondary | ICD-10-CM | POA: Diagnosis present

## 2017-10-25 DIAGNOSIS — M79661 Pain in right lower leg: Secondary | ICD-10-CM | POA: Insufficient documentation

## 2017-11-05 ENCOUNTER — Other Ambulatory Visit: Payer: Self-pay | Admitting: Family Medicine

## 2017-11-05 DIAGNOSIS — G4700 Insomnia, unspecified: Secondary | ICD-10-CM

## 2017-11-05 DIAGNOSIS — G4730 Sleep apnea, unspecified: Principal | ICD-10-CM

## 2017-11-05 NOTE — Telephone Encounter (Signed)
[facility] is requesting to fill pt ambien. Please advise KH 

## 2017-11-21 ENCOUNTER — Other Ambulatory Visit: Payer: Self-pay | Admitting: Family Medicine

## 2017-11-21 NOTE — Telephone Encounter (Signed)
[facility] is requesting to fill pt testosterone . Please advise KH 

## 2018-05-07 ENCOUNTER — Other Ambulatory Visit: Payer: Self-pay | Admitting: Family Medicine

## 2018-05-07 DIAGNOSIS — G4730 Sleep apnea, unspecified: Principal | ICD-10-CM

## 2018-05-07 DIAGNOSIS — G4700 Insomnia, unspecified: Secondary | ICD-10-CM

## 2018-05-07 NOTE — Telephone Encounter (Signed)
[facility] is requesting to fill pt zolpidem Please advise KH 

## 2018-05-24 ENCOUNTER — Other Ambulatory Visit: Payer: Self-pay | Admitting: Family Medicine

## 2018-05-24 DIAGNOSIS — G4700 Insomnia, unspecified: Secondary | ICD-10-CM

## 2018-05-24 DIAGNOSIS — G4730 Sleep apnea, unspecified: Principal | ICD-10-CM

## 2018-05-24 NOTE — Telephone Encounter (Signed)
Is this ok to refill?  

## 2018-05-27 NOTE — Telephone Encounter (Signed)
yours

## 2018-06-05 ENCOUNTER — Ambulatory Visit: Payer: BC Managed Care – PPO | Admitting: Family Medicine

## 2018-06-05 ENCOUNTER — Other Ambulatory Visit: Payer: Self-pay

## 2018-06-05 ENCOUNTER — Encounter: Payer: Self-pay | Admitting: Family Medicine

## 2018-06-05 VITALS — BP 117/78 | Wt 217.0 lb

## 2018-06-05 DIAGNOSIS — G4730 Sleep apnea, unspecified: Secondary | ICD-10-CM

## 2018-06-05 DIAGNOSIS — I10 Essential (primary) hypertension: Secondary | ICD-10-CM

## 2018-06-05 DIAGNOSIS — F341 Dysthymic disorder: Secondary | ICD-10-CM | POA: Diagnosis not present

## 2018-06-05 DIAGNOSIS — J309 Allergic rhinitis, unspecified: Secondary | ICD-10-CM | POA: Diagnosis not present

## 2018-06-05 DIAGNOSIS — E291 Testicular hypofunction: Secondary | ICD-10-CM

## 2018-06-05 DIAGNOSIS — E669 Obesity, unspecified: Secondary | ICD-10-CM

## 2018-06-05 DIAGNOSIS — D126 Benign neoplasm of colon, unspecified: Secondary | ICD-10-CM | POA: Diagnosis not present

## 2018-06-05 DIAGNOSIS — Z125 Encounter for screening for malignant neoplasm of prostate: Secondary | ICD-10-CM

## 2018-06-05 DIAGNOSIS — G4700 Insomnia, unspecified: Secondary | ICD-10-CM

## 2018-06-05 DIAGNOSIS — E785 Hyperlipidemia, unspecified: Secondary | ICD-10-CM

## 2018-06-05 MED ORDER — MONTELUKAST SODIUM 10 MG PO TABS: ORAL_TABLET | ORAL | 3 refills | Status: DC

## 2018-06-05 MED ORDER — VENLAFAXINE HCL ER 150 MG PO CP24: 150.0000 mg | ORAL_CAPSULE | Freq: Two times a day (BID) | ORAL | 3 refills | Status: DC

## 2018-06-05 MED ORDER — ZOLPIDEM TARTRATE ER 12.5 MG PO TBCR: 12.5000 mg | EXTENDED_RELEASE_TABLET | Freq: Every evening | ORAL | 5 refills | Status: DC | PRN

## 2018-06-05 MED ORDER — LISINOPRIL-HYDROCHLOROTHIAZIDE 10-12.5 MG PO TABS: ORAL_TABLET | ORAL | 3 refills | Status: DC

## 2018-06-05 MED ORDER — TESTOSTERONE 20.25 MG/ACT (1.62%) TD GEL: TRANSDERMAL | 5 refills | Status: DC

## 2018-06-05 MED ORDER — AZELASTINE HCL 0.1 % NA SOLN: NASAL | 3 refills | Status: DC

## 2018-06-05 NOTE — Addendum Note (Signed)
Addended by: Elyse Jarvis on: 06/05/2018 11:39 AM   Modules accepted: Orders

## 2018-06-05 NOTE — Progress Notes (Signed)
   Subjective:    Patient ID: Thomas Blankenship, male    DOB: 1960/06/06, 58 y.o.   MRN: [id number]  HPI Documentation for virtual telephone encounter.  Documentation for virtual audio and video telecommunications through [place] encounter: The patient was located at home. The provider was located in the office. The patient did consent to this visit and is aware of possible charges through their insurance for this visit. The other persons participating in this telemedicine service were none. Time spent on call was 10 minutes and in review of previous records >30 minutes total.  This virtual service is not related to other E/M service within previous 7 days. He is here for a virtual medication management visit.  He has no particular concerns or complaints.  He does have underlying allergies and continues on Astelin, Flonase and Singulair and having no difficulty with them.  He is also using testosterone regularly.  Continues on lisinopril/HCTZ.  He also takes Effexor and is very happy with the results there.  He does use Ambien to help with his sleep disturbance from the CPAP.  He wants to continue on that.  He has not had a CPAP readout in quite some time.  His work is going well.  Smoking and drinking as well as exercise was reviewed with him.  His work continues to keep him busy.  He states that he gets out and walks several times per week.    Review of Systems     Objective:   Physical Exam Alert and in no distress otherwise not examined       Assessment & Plan:  Essential hypertension - Plan: CBC with Differential/Platelet, Comprehensive metabolic panel, lisinopril-hydrochlorothiazide (ZESTORETIC) 10-12.5 MG tablet  Adenomatous polyp of colon, unspecified part of colon  Allergic rhinitis, mild - Plan: azelastine (ASTELIN) 0.1 % nasal spray, montelukast (SINGULAIR) 10 MG tablet  Dysthymia - Plan: venlafaxine XR (EFFEXOR-XR) 150 MG 24 hr capsule  Hypogonadism male - Plan:  Testosterone, Testosterone 20.25 MG/ACT (1.62%) GEL  Insomnia w/ sleep apnea - Plan: zolpidem (AMBIEN CR) 12.5 MG CR tablet  Obesity without serious comorbidity, unspecified classification, unspecified obesity type - Plan: CBC with Differential/Platelet, Comprehensive metabolic panel, Lipid panel  Screening for prostate cancer - Plan: PSA  Hyperlipidemia, unspecified hyperlipidemia type - Plan: Lipid panel He is to continue with his present medication regimen.  He is to get a CPAP read out.  Encouraged him to continue with his exercise.

## 2018-06-06 LAB — CBC WITH DIFFERENTIAL/PLATELET
Basophils Absolute: 0.1 10*3/uL (ref 0.0–0.2)
Basos: 1 %
EOS (ABSOLUTE): 0.2 10*3/uL (ref 0.0–0.4)
Eos: 3 %
Hematocrit: 48.2 % (ref 37.5–51.0)
Hemoglobin: 16.6 g/dL (ref 13.0–17.7)
Immature Grans (Abs): 0 10*3/uL (ref 0.0–0.1)
Immature Granulocytes: 0 %
Lymphocytes Absolute: 1.4 10*3/uL (ref 0.7–3.1)
Lymphs: 24 %
MCH: 30.3 pg (ref 26.6–33.0)
MCHC: 34.4 g/dL (ref 31.5–35.7)
MCV: 88 fL (ref 79–97)
Monocytes Absolute: 0.7 10*3/uL (ref 0.1–0.9)
Monocytes: 12 %
Neutrophils Absolute: 3.3 10*3/uL (ref 1.4–7.0)
Neutrophils: 60 %
Platelets: 314 10*3/uL (ref 150–450)
RBC: 5.47 x10E6/uL (ref 4.14–5.80)
RDW: 12.3 % (ref 11.6–15.4)
WBC: 5.6 10*3/uL (ref 3.4–10.8)

## 2018-06-06 LAB — COMPREHENSIVE METABOLIC PANEL
ALT: 15 IU/L (ref 0–44)
AST: 16 IU/L (ref 0–40)
Albumin/Globulin Ratio: 2.2 (ref 1.2–2.2)
Albumin: 4.2 g/dL (ref 3.8–4.9)
Alkaline Phosphatase: 90 IU/L (ref 39–117)
BUN/Creatinine Ratio: 13 (ref 9–20)
BUN: 13 mg/dL (ref 6–24)
Bilirubin Total: 0.4 mg/dL (ref 0.0–1.2)
CO2: 29 mmol/L (ref 20–29)
Calcium: 9.4 mg/dL (ref 8.7–10.2)
Chloride: 99 mmol/L (ref 96–106)
Creatinine, Ser: 1.04 mg/dL (ref 0.76–1.27)
GFR calc Af Amer: 93 mL/min/{1.73_m2} (ref >59)
GFR calc non Af Amer: 80 mL/min/{1.73_m2} (ref >59)
Globulin, Total: 1.9 g/dL (ref 1.5–4.5)
Glucose: 83 mg/dL (ref 65–99)
Potassium: 4.4 mmol/L (ref 3.5–5.2)
Sodium: 140 mmol/L (ref 134–144)
Total Protein: 6.1 g/dL (ref 6.0–8.5)

## 2018-06-06 LAB — LIPID PANEL
Chol/HDL Ratio: 4.6 ratio (ref 0.0–5.0)
Cholesterol, Total: 187 mg/dL (ref 100–199)
HDL: 41 mg/dL (ref >39)
LDL Calculated: 113 mg/dL — ABNORMAL HIGH (ref 0–99)
Triglycerides: 164 mg/dL — ABNORMAL HIGH (ref 0–149)
VLDL Cholesterol Cal: 33 mg/dL (ref 5–40)

## 2018-06-06 LAB — PSA: Prostate Specific Ag, Serum: 0.8 ng/mL (ref 0.0–4.0)

## 2018-06-06 LAB — TESTOSTERONE: Testosterone: 508 ng/dL (ref 264–916)

## 2018-07-24 ENCOUNTER — Telehealth: Payer: Self-pay

## 2018-07-24 DIAGNOSIS — E291 Testicular hypofunction: Secondary | ICD-10-CM

## 2018-07-24 MED ORDER — TESTOSTERONE 20.25 MG/ACT (1.62%) TD GEL: TRANSDERMAL | 5 refills | Status: DC

## 2018-07-24 NOTE — Telephone Encounter (Signed)
[facility] has sent fax stating that patient will need 150gm to have 120 pumps for 30 days for testosterone gel. Change is being made and sent.

## 2018-12-04 ENCOUNTER — Other Ambulatory Visit: Payer: Self-pay | Admitting: Family Medicine

## 2018-12-04 DIAGNOSIS — G4730 Sleep apnea, unspecified: Secondary | ICD-10-CM

## 2018-12-04 DIAGNOSIS — G4700 Insomnia, unspecified: Secondary | ICD-10-CM

## 2018-12-04 NOTE — Telephone Encounter (Signed)
Is this okay to refill? 

## 2018-12-05 NOTE — Telephone Encounter (Signed)
Make sure he has a follow-up appointment within the next month or so

## 2018-12-05 NOTE — Telephone Encounter (Signed)
LVM for pt and will send my cart message. [place]

## 2018-12-16 ENCOUNTER — Other Ambulatory Visit: Payer: Self-pay | Admitting: Family Medicine

## 2018-12-16 DIAGNOSIS — E291 Testicular hypofunction: Secondary | ICD-10-CM

## 2018-12-16 NOTE — Telephone Encounter (Signed)
Is this okay to refill? 

## 2018-12-17 ENCOUNTER — Ambulatory Visit: Payer: BC Managed Care – PPO | Admitting: Family Medicine

## 2018-12-17 ENCOUNTER — Other Ambulatory Visit: Payer: Self-pay

## 2018-12-17 ENCOUNTER — Encounter: Payer: Self-pay | Admitting: Family Medicine

## 2018-12-17 VITALS — BP 130/82 | HR 90 | Temp 97.7°F | Wt 240.0 lb

## 2018-12-17 DIAGNOSIS — G4733 Obstructive sleep apnea (adult) (pediatric): Secondary | ICD-10-CM | POA: Insufficient documentation

## 2018-12-17 DIAGNOSIS — E291 Testicular hypofunction: Secondary | ICD-10-CM | POA: Diagnosis not present

## 2018-12-17 DIAGNOSIS — J309 Allergic rhinitis, unspecified: Secondary | ICD-10-CM | POA: Diagnosis not present

## 2018-12-17 DIAGNOSIS — Z9989 Dependence on other enabling machines and devices: Secondary | ICD-10-CM

## 2018-12-17 DIAGNOSIS — E669 Obesity, unspecified: Secondary | ICD-10-CM

## 2018-12-17 DIAGNOSIS — G4730 Sleep apnea, unspecified: Secondary | ICD-10-CM

## 2018-12-17 DIAGNOSIS — G4700 Insomnia, unspecified: Secondary | ICD-10-CM

## 2018-12-17 DIAGNOSIS — F341 Dysthymic disorder: Secondary | ICD-10-CM | POA: Diagnosis not present

## 2018-12-17 NOTE — Progress Notes (Signed)
   Subjective:    Patient ID: Thomas Blankenship, male    DOB: [birth year], 58 y.o.   MRN: [id number]  HPI He is here for follow-up visit.  He continues on testosterone and is having no difficulty with that.  He also has OSA and is using a CPAP.  He is very happy with the respons from that.  His allergies seem to be under good control.  He has had some difficulties with his weight.  He is now going back to his old eating habits and has gained some weight back.  He continues to do well on his Effexor and would like to continue on it.   Review of Systems     Objective:   Physical Exam Alert and in no distress with appropriate affect.       Assessment & Plan:  Hypogonadism male - Plan: Testosterone  Allergic rhinitis, mild  Dysthymia  Obesity without serious comorbidity, unspecified classification, unspecified obesity type  Insomnia w/ sleep apnea  OSA on CPAP He will continue on CPAP.  He is using Ambien appropriately.  Discussed his weight with him and strongly encouraged him to get back to his old eating habits.

## 2018-12-18 LAB — TESTOSTERONE: Testosterone: 75 ng/dL — ABNORMAL LOW (ref 264–916)

## 2018-12-20 ENCOUNTER — Other Ambulatory Visit: Payer: Self-pay | Admitting: Family Medicine

## 2018-12-20 DIAGNOSIS — G4700 Insomnia, unspecified: Secondary | ICD-10-CM

## 2018-12-20 DIAGNOSIS — G4730 Sleep apnea, unspecified: Secondary | ICD-10-CM

## 2019-02-09 ENCOUNTER — Other Ambulatory Visit: Payer: Self-pay | Admitting: Family Medicine

## 2019-02-09 DIAGNOSIS — E291 Testicular hypofunction: Secondary | ICD-10-CM

## 2019-02-10 NOTE — Telephone Encounter (Signed)
[facility]. Please advise [facility]

## 2019-02-24 ENCOUNTER — Other Ambulatory Visit: Payer: Self-pay | Admitting: Family Medicine

## 2019-02-24 DIAGNOSIS — G4700 Insomnia, unspecified: Secondary | ICD-10-CM

## 2019-02-24 NOTE — Telephone Encounter (Signed)
[facility] is requesting to fill pt zolpidem Please advise KH 

## 2019-03-27 ENCOUNTER — Encounter: Payer: Self-pay | Admitting: Family Medicine

## 2019-03-27 ENCOUNTER — Ambulatory Visit: Payer: BC Managed Care – PPO | Admitting: Family Medicine

## 2019-03-27 ENCOUNTER — Other Ambulatory Visit: Payer: Self-pay

## 2019-03-27 VITALS — BP 122/76 | HR 101 | Temp 96.6°F | Wt 235.4 lb

## 2019-03-27 DIAGNOSIS — G4733 Obstructive sleep apnea (adult) (pediatric): Secondary | ICD-10-CM

## 2019-03-27 DIAGNOSIS — G4700 Insomnia, unspecified: Secondary | ICD-10-CM

## 2019-03-27 DIAGNOSIS — E291 Testicular hypofunction: Secondary | ICD-10-CM | POA: Diagnosis not present

## 2019-03-27 DIAGNOSIS — G4730 Sleep apnea, unspecified: Secondary | ICD-10-CM

## 2019-03-27 DIAGNOSIS — F341 Dysthymic disorder: Secondary | ICD-10-CM

## 2019-03-27 DIAGNOSIS — Z9989 Dependence on other enabling machines and devices: Secondary | ICD-10-CM

## 2019-03-27 MED ORDER — ZOLPIDEM TARTRATE ER 12.5 MG PO TBCR: 12.5000 mg | EXTENDED_RELEASE_TABLET | Freq: Every evening | ORAL | 1 refills | Status: DC | PRN

## 2019-03-27 NOTE — Progress Notes (Signed)
   Subjective:    Patient ID: Thomas Blankenship, male    DOB: 29-May-1960, 59 y.o.   MRN: [id number]  HPI He is here for a recheck.  On his last visit, his testosterone was quite low.  Further discussion with him indicates he did miss a few doses.  He uses 4 pumps per day he also would like his Ambien renewed for a longer period of time.  Otherwise he is doing fairly well especially considering Covid and the psychological issues surrounding that.  His Effexor continues to work well for him. He also had questions concerning care for his brother.  Review of Systems     Objective:   Physical Exam Alert and in no distress otherwise not examined      Assessment & Plan:  Hypogonadism male - Plan: Testosterone  OSA on CPAP  Insomnia w/ sleep apnea - Plan: zolpidem (AMBIEN CR) 12.5 MG CR tablet  Dysthymia He will continue on his present medication regimen.  I will give him a 90-day supply of the Ambien.  Recommended more conscientious use of the testosterone. Gave him the phone number of the [facility] for his brother.

## 2019-03-28 LAB — TESTOSTERONE: Testosterone: 1072 ng/dL — ABNORMAL HIGH (ref 264–916)

## 2019-04-04 ENCOUNTER — Other Ambulatory Visit: Payer: Self-pay | Admitting: Family Medicine

## 2019-04-04 DIAGNOSIS — E291 Testicular hypofunction: Secondary | ICD-10-CM

## 2019-04-04 NOTE — Telephone Encounter (Signed)
[facility] is requesting to fill pt testosterone . Please advise [facility]

## 2019-04-10 ENCOUNTER — Ambulatory Visit: Payer: BC Managed Care – PPO | Attending: Family

## 2019-04-10 DIAGNOSIS — Z23 Encounter for immunization: Secondary | ICD-10-CM

## 2019-04-10 NOTE — Progress Notes (Signed)
   Covid-19 Vaccination Clinic  Name:  Thomas Blankenship    MRN: [id number] DOB: [birth year]  04/10/2019  [name] was observed post Covid-19 immunization for 15 minutes without incident. He was provided with Vaccine Information Sheet and instruction to access the V-Safe system.   [name] was instructed to call 911 with any severe reactions post vaccine: Marland Kitchen Difficulty breathing  . Swelling of face and throat  . A fast heartbeat  . A bad rash all over body  . Dizziness and weakness   Immunizations Administered    Name Date Dose VIS Date Route   Moderna COVID-19 Vaccine 04/10/2019 10:15 AM 0.5 mL 12/31/2018 Intramuscular   Manufacturer: Moderna   Lot: [id number]   [id number]

## 2019-04-15 ENCOUNTER — Telehealth: Payer: Self-pay

## 2019-04-15 NOTE — Telephone Encounter (Signed)
I submitted a PA for the pts. Zolpidem Tartrate 12.5mg  and it was approved from 04/14/19-04/14/22.

## 2019-05-13 ENCOUNTER — Ambulatory Visit: Payer: BC Managed Care – PPO | Attending: Family

## 2019-05-13 DIAGNOSIS — Z23 Encounter for immunization: Secondary | ICD-10-CM

## 2019-05-13 NOTE — Progress Notes (Signed)
   Covid-19 Vaccination Clinic  Name:  Thomas Blankenship    MRN: [id number] DOB: 02/23/1960  05/13/2019  [name] was observed post Covid-19 immunization for 15 minutes without incident. He was provided with Vaccine Information Sheet and instruction to access the V-Safe system.   [name] was instructed to call 911 with any severe reactions post vaccine: Marland Kitchen Difficulty breathing  . Swelling of face and throat  . A fast heartbeat  . A bad rash all over body  . Dizziness and weakness   Immunizations Administered    Name Date Dose VIS Date Route   Moderna COVID-19 Vaccine 05/13/2019 11:05 AM 0.5 mL 12/31/2018 Intramuscular   Manufacturer: Levan Hurst   Lot: [id number]   [place]: [id number]      Covid-19 Vaccination Clinic  Name:  Thomas Blankenship    MRN: [id number] DOB: [birth year]  05/13/2019  [name] was observed post Covid-19 immunization for 15 minutes without incident. He was provided with Vaccine Information Sheet and instruction to access the V-Safe system.   [name] was instructed to call 911 with any severe reactions post vaccine: Marland Kitchen Difficulty breathing  . Swelling of face and throat  . A fast heartbeat  . A bad rash all over body  . Dizziness and weakness   Immunizations Administered    Name Date Dose VIS Date Route   Moderna COVID-19 Vaccine 05/13/2019 11:05 AM 0.5 mL 12/31/2018 Intramuscular   Manufacturer: Moderna   Lot: [id number]   [place]:[id number]

## 2019-05-27 ENCOUNTER — Other Ambulatory Visit: Payer: Self-pay | Admitting: Family Medicine

## 2019-05-27 DIAGNOSIS — E291 Testicular hypofunction: Secondary | ICD-10-CM

## 2019-05-27 NOTE — Telephone Encounter (Signed)
[facility] pt testosterone. Please advise [facility]

## 2019-07-14 ENCOUNTER — Other Ambulatory Visit: Payer: Self-pay | Admitting: Family Medicine

## 2019-07-14 DIAGNOSIS — E291 Testicular hypofunction: Secondary | ICD-10-CM

## 2019-07-16 NOTE — Telephone Encounter (Signed)
Pt has been out made an appt for 08-08-19. Please fill due to pt already being out for two days. [place]

## 2019-07-16 NOTE — Telephone Encounter (Signed)
Have him come in for a blood draw concerning testosterone.  Let him know that his last 1 was a little high and we need to recheck this

## 2019-07-25 ENCOUNTER — Other Ambulatory Visit: Payer: Self-pay | Admitting: Family Medicine

## 2019-07-25 DIAGNOSIS — J309 Allergic rhinitis, unspecified: Secondary | ICD-10-CM

## 2019-08-08 ENCOUNTER — Other Ambulatory Visit: Payer: Self-pay

## 2019-08-08 ENCOUNTER — Encounter: Payer: Self-pay | Admitting: Family Medicine

## 2019-08-08 ENCOUNTER — Ambulatory Visit: Payer: BC Managed Care – PPO | Admitting: Family Medicine

## 2019-08-08 VITALS — BP 120/82 | HR 99 | Temp 97.9°F | Wt 246.6 lb

## 2019-08-08 DIAGNOSIS — E291 Testicular hypofunction: Secondary | ICD-10-CM | POA: Diagnosis not present

## 2019-08-08 NOTE — Progress Notes (Signed)
° °  Subjective:    Patient ID: Thomas Blankenship, male    DOB: 09/09/1960, 59 y.o.   MRN: [id number]  HPI He is here for recheck on his testosterone.  He had blood drawn recently which did show a quite elevated testosterone.  He has been taking 4 pumps per day for that.  He states that he has good energy and stamina.   Review of Systems     Objective:   Physical Exam Alert and in no distress otherwise not examined       Assessment & Plan:  Hypogonadism male He will cut back on his dosing to 3 pumps per day and we will recheck his testosterone in 1 month.

## 2019-08-12 ENCOUNTER — Other Ambulatory Visit: Payer: Self-pay | Admitting: Family Medicine

## 2019-08-12 DIAGNOSIS — J309 Allergic rhinitis, unspecified: Secondary | ICD-10-CM

## 2019-09-08 ENCOUNTER — Encounter: Payer: Self-pay | Admitting: Family Medicine

## 2019-09-08 ENCOUNTER — Other Ambulatory Visit: Payer: Self-pay

## 2019-09-08 ENCOUNTER — Ambulatory Visit: Payer: BC Managed Care – PPO | Admitting: Family Medicine

## 2019-09-08 VITALS — BP 110/76 | HR 64 | Temp 97.2°F | Wt 250.8 lb

## 2019-09-08 DIAGNOSIS — E291 Testicular hypofunction: Secondary | ICD-10-CM | POA: Diagnosis not present

## 2019-09-08 NOTE — Progress Notes (Signed)
   Subjective:    Patient ID: Thomas Blankenship, male    DOB: 04-14-1960, 59 y.o.   MRN: [id number]  HPI He is here for recheck on his testosterone.  He is using 3 pumps per day and applying it to his torso.  He notes no real difference in energy, stamina and libido.   Review of Systems     Objective:   Physical Exam  Alert and in no distress otherwise not examined      Assessment & Plan:

## 2019-09-09 LAB — TESTOSTERONE: Testosterone: 359 ng/dL (ref 264–916)

## 2019-09-09 MED ORDER — TESTOSTERONE 20.25 MG/ACT (1.62%) TD GEL: TRANSDERMAL | 5 refills | Status: DC

## 2019-09-09 NOTE — Addendum Note (Signed)
Addended by: Denita Lung on: 09/09/2019 01:17 PM   Modules accepted: Orders

## 2019-09-12 ENCOUNTER — Other Ambulatory Visit: Payer: Self-pay | Admitting: Family Medicine

## 2019-09-12 DIAGNOSIS — F341 Dysthymic disorder: Secondary | ICD-10-CM

## 2019-09-30 ENCOUNTER — Other Ambulatory Visit: Payer: Self-pay | Admitting: Family Medicine

## 2019-09-30 DIAGNOSIS — G4730 Sleep apnea, unspecified: Secondary | ICD-10-CM

## 2019-09-30 DIAGNOSIS — I10 Essential (primary) hypertension: Secondary | ICD-10-CM

## 2019-09-30 NOTE — Telephone Encounter (Signed)
Is this ok?

## 2019-11-03 ENCOUNTER — Other Ambulatory Visit: Payer: Self-pay | Admitting: Family Medicine

## 2019-11-03 DIAGNOSIS — J309 Allergic rhinitis, unspecified: Secondary | ICD-10-CM

## 2019-11-03 DIAGNOSIS — I10 Essential (primary) hypertension: Secondary | ICD-10-CM

## 2019-12-09 ENCOUNTER — Other Ambulatory Visit: Payer: Self-pay | Admitting: Family Medicine

## 2019-12-09 DIAGNOSIS — I10 Essential (primary) hypertension: Secondary | ICD-10-CM

## 2019-12-11 ENCOUNTER — Other Ambulatory Visit: Payer: Self-pay | Admitting: Family Medicine

## 2019-12-11 DIAGNOSIS — J309 Allergic rhinitis, unspecified: Secondary | ICD-10-CM

## 2019-12-22 ENCOUNTER — Other Ambulatory Visit: Payer: Self-pay | Admitting: Family Medicine

## 2019-12-22 DIAGNOSIS — F341 Dysthymic disorder: Secondary | ICD-10-CM

## 2020-01-14 ENCOUNTER — Other Ambulatory Visit: Payer: Self-pay | Admitting: Family Medicine

## 2020-01-14 DIAGNOSIS — I10 Essential (primary) hypertension: Secondary | ICD-10-CM

## 2020-01-16 ENCOUNTER — Other Ambulatory Visit: Payer: Self-pay | Admitting: Family Medicine

## 2020-01-16 DIAGNOSIS — I10 Essential (primary) hypertension: Secondary | ICD-10-CM

## 2020-02-19 ENCOUNTER — Other Ambulatory Visit: Payer: Self-pay | Admitting: Family Medicine

## 2020-02-19 DIAGNOSIS — J309 Allergic rhinitis, unspecified: Secondary | ICD-10-CM

## 2020-02-23 ENCOUNTER — Other Ambulatory Visit: Payer: Self-pay | Admitting: Family Medicine

## 2020-02-23 DIAGNOSIS — I10 Essential (primary) hypertension: Secondary | ICD-10-CM

## 2020-03-25 ENCOUNTER — Other Ambulatory Visit: Payer: Self-pay | Admitting: Family Medicine

## 2020-03-25 DIAGNOSIS — I10 Essential (primary) hypertension: Secondary | ICD-10-CM

## 2020-03-30 ENCOUNTER — Encounter: Payer: Self-pay | Admitting: Family Medicine

## 2020-03-30 ENCOUNTER — Other Ambulatory Visit: Payer: Self-pay

## 2020-03-30 ENCOUNTER — Ambulatory Visit: Payer: BC Managed Care – PPO | Admitting: Family Medicine

## 2020-03-30 ENCOUNTER — Other Ambulatory Visit: Payer: Self-pay | Admitting: Family Medicine

## 2020-03-30 DIAGNOSIS — Z0000 Encounter for general adult medical examination without abnormal findings: Secondary | ICD-10-CM | POA: Diagnosis not present

## 2020-03-30 DIAGNOSIS — F341 Dysthymic disorder: Secondary | ICD-10-CM

## 2020-03-30 DIAGNOSIS — I10 Essential (primary) hypertension: Secondary | ICD-10-CM

## 2020-03-30 DIAGNOSIS — E669 Obesity, unspecified: Secondary | ICD-10-CM | POA: Diagnosis not present

## 2020-03-30 DIAGNOSIS — Z9989 Dependence on other enabling machines and devices: Secondary | ICD-10-CM

## 2020-03-30 DIAGNOSIS — J309 Allergic rhinitis, unspecified: Secondary | ICD-10-CM

## 2020-03-30 DIAGNOSIS — G4700 Insomnia, unspecified: Secondary | ICD-10-CM

## 2020-03-30 DIAGNOSIS — R011 Cardiac murmur, unspecified: Secondary | ICD-10-CM | POA: Diagnosis not present

## 2020-03-30 DIAGNOSIS — Z125 Encounter for screening for malignant neoplasm of prostate: Secondary | ICD-10-CM

## 2020-03-30 DIAGNOSIS — G4733 Obstructive sleep apnea (adult) (pediatric): Secondary | ICD-10-CM

## 2020-03-30 DIAGNOSIS — D126 Benign neoplasm of colon, unspecified: Secondary | ICD-10-CM

## 2020-03-30 DIAGNOSIS — G4730 Sleep apnea, unspecified: Secondary | ICD-10-CM

## 2020-03-30 DIAGNOSIS — E291 Testicular hypofunction: Secondary | ICD-10-CM | POA: Diagnosis not present

## 2020-03-30 MED ORDER — MONTELUKAST SODIUM 10 MG PO TABS: 10.0000 mg | ORAL_TABLET | Freq: Every day | ORAL | 3 refills | Status: DC

## 2020-03-30 MED ORDER — AZELASTINE HCL 137 MCG/SPRAY NA SOLN: NASAL | 3 refills | Status: DC

## 2020-03-30 MED ORDER — TESTOSTERONE 20.25 MG/ACT (1.62%) TD GEL: TRANSDERMAL | 5 refills | Status: DC

## 2020-03-30 MED ORDER — VENLAFAXINE HCL ER 150 MG PO CP24: 150.0000 mg | ORAL_CAPSULE | Freq: Two times a day (BID) | ORAL | 3 refills | Status: DC

## 2020-03-30 MED ORDER — LISINOPRIL-HYDROCHLOROTHIAZIDE 10-12.5 MG PO TABS: 1.0000 | ORAL_TABLET | Freq: Every day | ORAL | 3 refills | Status: DC

## 2020-03-30 NOTE — Progress Notes (Signed)
   Subjective:    Patient ID: Thomas Blankenship, male    DOB: [birth year], 60 y.o.   MRN: [id number]  HPI He is here for complete examination.  He continues on lisinopril and is having no difficulty with that.  He also is on Singulair, azelastine and Flonase for his underlying allergies.  Continues on Effexor which he states helps keep him mentally stable.  He does use Ambien for his OSA and sleep apnea.  Continues on testosterone without difficulty.  His CPAP machine is quite old and is time for a new one.  He does have a history of colonic polyps and is set up for colonoscopy later this year.  Work is going well.  Family and social history as well as health maintenance and immunizations was reviewed.   Review of Systems  All other systems reviewed and are negative.      Objective:   Physical Exam Alert and in no distress. Tympanic membranes and canals are normal. Pharyngeal area is normal. Neck is supple without adenopathy or thyromegaly. Cardiac exam shows a regular sinus rhythm ,2/6 SEM heard best along the left sternal border no gallops. Lungs are clear to auscultation. EKG read by me today shows a rate of 92 with other parameters being normal.  Normal R wave progression.      Assessment & Plan:  Routine general medical examination at a health care facility - Plan: CBC with Differential/Platelet, Comprehensive metabolic panel, Lipid panel  Primary hypertension  Hypogonadism male - Plan: Testosterone  Obesity without serious comorbidity, unspecified classification, unspecified obesity type  Dysthymia  Allergic rhinitis, mild  Insomnia w/ sleep apnea  OSA on CPAP  Adenomatous polyp of colon, unspecified part of colon  Systolic murmur - Plan: ECHOCARDIOGRAM COMPLETE, EKG 12-Lead  Screening for prostate cancer - Plan: PSA We will arrange to get a new CPAP machine for him.  Also set up echocardiogram due to the murmur.  Continue on his allergy medications as well as sleep  meds I discussed his murmur with him and explained the need for follow-up and possible need for cardiology.

## 2020-03-31 ENCOUNTER — Telehealth: Payer: Self-pay

## 2020-03-31 LAB — CBC WITH DIFFERENTIAL/PLATELET
Basophils Absolute: 0.1 10*3/uL (ref 0.0–0.2)
Basos: 1 %
EOS (ABSOLUTE): 0.4 10*3/uL (ref 0.0–0.4)
Eos: 5 %
Hematocrit: 56.9 % — ABNORMAL HIGH (ref 37.5–51.0)
Hemoglobin: 19 g/dL — ABNORMAL HIGH (ref 13.0–17.7)
Immature Grans (Abs): 0 10*3/uL (ref 0.0–0.1)
Immature Granulocytes: 0 %
Lymphocytes Absolute: 2 10*3/uL (ref 0.7–3.1)
Lymphs: 24 %
MCH: 30.2 pg (ref 26.6–33.0)
MCHC: 33.4 g/dL (ref 31.5–35.7)
MCV: 91 fL (ref 79–97)
Monocytes Absolute: 0.8 10*3/uL (ref 0.1–0.9)
Monocytes: 10 %
Neutrophils Absolute: 5.1 10*3/uL (ref 1.4–7.0)
Neutrophils: 60 %
Platelets: 322 10*3/uL (ref 150–450)
RBC: 6.29 x10E6/uL — ABNORMAL HIGH (ref 4.14–5.80)
RDW: 12.4 % (ref 11.6–15.4)
WBC: 8.4 10*3/uL (ref 3.4–10.8)

## 2020-03-31 LAB — PSA: Prostate Specific Ag, Serum: 1.3 ng/mL (ref 0.0–4.0)

## 2020-03-31 LAB — COMPREHENSIVE METABOLIC PANEL
ALT: 20 IU/L (ref 0–44)
AST: 20 IU/L (ref 0–40)
Albumin/Globulin Ratio: 1.8 (ref 1.2–2.2)
Albumin: 4.3 g/dL (ref 3.8–4.9)
Alkaline Phosphatase: 107 IU/L (ref 44–121)
BUN/Creatinine Ratio: 12 (ref 9–20)
BUN: 15 mg/dL (ref 6–24)
Bilirubin Total: 0.4 mg/dL (ref 0.0–1.2)
CO2: 25 mmol/L (ref 20–29)
Calcium: 9.3 mg/dL (ref 8.7–10.2)
Chloride: 98 mmol/L (ref 96–106)
Creatinine, Ser: 1.21 mg/dL (ref 0.76–1.27)
Globulin, Total: 2.4 g/dL (ref 1.5–4.5)
Glucose: 114 mg/dL — ABNORMAL HIGH (ref 65–99)
Potassium: 4.3 mmol/L (ref 3.5–5.2)
Sodium: 140 mmol/L (ref 134–144)
Total Protein: 6.7 g/dL (ref 6.0–8.5)
eGFR: 70 mL/min/{1.73_m2} (ref >59)

## 2020-03-31 LAB — LIPID PANEL
Chol/HDL Ratio: 5 ratio (ref 0.0–5.0)
Cholesterol, Total: 223 mg/dL — ABNORMAL HIGH (ref 100–199)
HDL: 45 mg/dL (ref >39)
LDL Chol Calc (NIH): 121 mg/dL — ABNORMAL HIGH (ref 0–99)
Triglycerides: 327 mg/dL — ABNORMAL HIGH (ref 0–149)
VLDL Cholesterol Cal: 57 mg/dL — ABNORMAL HIGH (ref 5–40)

## 2020-03-31 LAB — TESTOSTERONE: Testosterone: 516 ng/dL (ref 264–916)

## 2020-03-31 NOTE — Telephone Encounter (Signed)
Is this okay to refill? 

## 2020-03-31 NOTE — Telephone Encounter (Signed)
Received a fax from [facility] for a refill on his Zolpidem pt. Last apt was 03/30/20.

## 2020-04-03 LAB — SPECIMEN STATUS REPORT

## 2020-04-03 LAB — ERYTHROPOIETIN: Erythropoietin: 7.4 m[IU]/mL (ref 2.6–18.5)

## 2020-04-27 ENCOUNTER — Ambulatory Visit: Payer: BC Managed Care – PPO | Admitting: Family Medicine

## 2020-04-27 ENCOUNTER — Encounter: Payer: Self-pay | Admitting: Family Medicine

## 2020-04-27 ENCOUNTER — Other Ambulatory Visit: Payer: Self-pay

## 2020-04-27 VITALS — BP 108/70 | HR 88 | Temp 97.1°F | Wt 241.4 lb

## 2020-04-27 DIAGNOSIS — Z9989 Dependence on other enabling machines and devices: Secondary | ICD-10-CM

## 2020-04-27 DIAGNOSIS — E291 Testicular hypofunction: Secondary | ICD-10-CM

## 2020-04-27 DIAGNOSIS — G4733 Obstructive sleep apnea (adult) (pediatric): Secondary | ICD-10-CM | POA: Diagnosis not present

## 2020-04-27 DIAGNOSIS — E669 Obesity, unspecified: Secondary | ICD-10-CM

## 2020-04-27 NOTE — Progress Notes (Signed)
   Subjective:    Patient ID: Thomas Blankenship, male    DOB: 1960/05/30, 60 y.o.   MRN: [id number]  HPI He is here for recheck.  He continues to complain of fatigue that he states got much worse starting around Christmas.  His echocardiogram is scheduled for Thursday.  He does have OSA and is on CPAP.  He recently readjusted his CPAP to auto titrate going from for up to 20.  He states that this has helped slightly.  His most recent AHI is 6 with a heart percent usage of his same greater than 4 hours.  His testosterone level is in a good range.   Review of Systems     Objective:   Physical Exam Alert and in no distress otherwise not examined       Assessment & Plan:  OSA on CPAP - Plan: Pulse oximetry, overnight  Hypogonadism male  Obesity without serious comorbidity, unspecified classification, unspecified obesity type I will get an overnight oximetry to see what that is to see if that will help with his fatigue as well as his elevated hemoglobin/hematocrit.  May need to refer to a sleep specialist for further evaluation of this and also possibly to hematology for the elevated hematocrit.  Must consider polycythemia although need to make sure that there is nothing else needs to be done for the OSA.

## 2020-04-29 ENCOUNTER — Ambulatory Visit (HOSPITAL_COMMUNITY): Payer: BC Managed Care – PPO | Attending: Cardiovascular Disease

## 2020-04-29 ENCOUNTER — Other Ambulatory Visit: Payer: Self-pay

## 2020-04-29 ENCOUNTER — Other Ambulatory Visit: Payer: Self-pay | Admitting: Family Medicine

## 2020-04-29 DIAGNOSIS — R011 Cardiac murmur, unspecified: Secondary | ICD-10-CM | POA: Diagnosis not present

## 2020-04-29 DIAGNOSIS — I10 Essential (primary) hypertension: Secondary | ICD-10-CM

## 2020-04-29 LAB — ECHOCARDIOGRAM COMPLETE
Area-P 1/2: 3.99 cm2
S' Lateral: 2.4 cm

## 2020-04-29 MED ORDER — PERFLUTREN LIPID MICROSPHERE
1.0000 mL | INTRAVENOUS | Status: AC | PRN
  Administered 2020-04-29: 1 mL via INTRAVENOUS

## 2020-05-13 ENCOUNTER — Telehealth: Payer: Self-pay

## 2020-05-13 NOTE — Telephone Encounter (Signed)
Call [facility] to find out if they will do pt pulse ox over night study. LVM due to no answer. [place]

## 2020-05-26 ENCOUNTER — Other Ambulatory Visit: Payer: Self-pay | Admitting: Family Medicine

## 2020-05-26 DIAGNOSIS — J309 Allergic rhinitis, unspecified: Secondary | ICD-10-CM

## 2020-05-27 NOTE — Telephone Encounter (Signed)
This was filled on 04/09/20 for a year supply

## 2020-06-29 ENCOUNTER — Telehealth: Payer: Self-pay

## 2020-06-29 NOTE — Telephone Encounter (Signed)
error 

## 2020-07-10 ENCOUNTER — Telehealth: Payer: Self-pay

## 2020-07-10 NOTE — Telephone Encounter (Signed)
P.A. TESTOSTERONE GEL  °

## 2020-07-12 NOTE — Telephone Encounter (Signed)
P.A. approved til 07/11/23, faxed pharmacy, left message for pt

## 2020-08-23 ENCOUNTER — Other Ambulatory Visit: Payer: Self-pay

## 2020-08-23 ENCOUNTER — Ambulatory Visit: Payer: BC Managed Care – PPO | Admitting: Family Medicine

## 2020-08-23 DIAGNOSIS — E669 Obesity, unspecified: Secondary | ICD-10-CM | POA: Diagnosis not present

## 2020-08-23 DIAGNOSIS — G4733 Obstructive sleep apnea (adult) (pediatric): Secondary | ICD-10-CM

## 2020-08-23 DIAGNOSIS — Z9989 Dependence on other enabling machines and devices: Secondary | ICD-10-CM

## 2020-08-23 NOTE — Progress Notes (Signed)
   Subjective:    Patient ID: Thomas Blankenship, male    DOB: 10/30/1960, 60 y.o.   MRN: [id number]  HPI He is here for recheck.  He has noted difficulty with his CPAP and did bring readings in.  It does indicate that when he lies flat on his back, he has more difficulty with breathing.  He has been using a tennis ball and night shirt but apparently his bed is too soft and it has not been successful.   Review of Systems     Objective:   Physical Exam Alert and in no distress otherwise not examined       Assessment & Plan:  OSA on CPAP  Obesity without serious comorbidity, unspecified classification, unspecified obesity type I discussed various options with him concerning this.  Discussed the use of something a little larger but I tennis ball or some other object.  He did note that on [place] he did find something that could be strapped on that is bigger than a tennis ball that might help.  He will try this.  I then discussed weight reduction with him as a way to take care of the OSA and also for general health.  I will refer him to medical weight loss and wellness.

## 2020-09-07 ENCOUNTER — Ambulatory Visit: Payer: BC Managed Care – PPO | Admitting: Medical

## 2020-09-07 ENCOUNTER — Other Ambulatory Visit: Payer: Self-pay

## 2020-09-07 VITALS — BP 118/68 | HR 96 | Temp 97.9°F | Wt 239.8 lb

## 2020-09-07 DIAGNOSIS — L237 Allergic contact dermatitis due to plants, except food: Secondary | ICD-10-CM

## 2020-09-07 NOTE — Patient Instructions (Signed)
Rash is suggestive of contact dermatitis, likely from poison ivy  Recommendations Clean body and face with soap and water Clean utensil you used last weekend to remove any other plant oil Begin Benadryl over the counter 12.5 - '25mg'$  up to 3 times daily for the next several days.  Caution with sedation. Continue the OTC hydrocortisone cream you are current using If new or much worse symptoms this week, call or recheck.  Sometimes we have to add prednisone oral or steroid shot to reduce flare

## 2020-09-07 NOTE — Progress Notes (Signed)
Subjective:  Thomas Blankenship is a 60 y.o. male who presents for Chief Complaint  Patient presents with   rash    Rash near right eye at eyebrow. Some swelling at eye lid but no blurred vision. Some itching. No pain     Here for rash.  Started few days ago.  Thinks it may have occurred after cleaning up some brush behind father's outbuilding, may have been exposed to poison ivy.    Using OTC hydrocortisone cream.   Has had reaction to poison ivy prior.  No other aggravating or relieving factors.    No other c/o.  The following portions of the patient's history were reviewed and updated as appropriate: allergies, current medications, past family history, past medical history, past social history, past surgical history and problem list.  ROS Otherwise as in subjective above  Objective: BP 118/68   Pulse 96   Temp 97.9 F (36.6 C)   Wt 239 lb 12.8 oz (108.8 kg)   BMI 38.70 kg/m   General appearance: alert, no distress, well developed, well nourished Skin: pink puffy urticarial rash over left forehead between eyes and in medial upper portion of eye orbit.   Similar puffy urticarial rash of upper left eyelid.   Eyes: PERRLA, EOMi, no erythema of conjunctiva    Assessment: Encounter Diagnosis  Name Primary?   Allergic contact dermatitis due to plants, except food Yes     Plan: Begin benadryl OTC, 12.'5mg'$ - '25mg'$  up to TID the next few days but caution on sedation.  C/t OTC hydrocortisone cream, continue cleaning area with soap and water bathing.   If not much improved within 2-3 days or if new or worse symptoms call or recheck.   We would add oral prednisone if not improving.   Lanie was seen today for rash.  Diagnoses and all orders for this visit:  Allergic contact dermatitis due to plants, except food   Follow up: prn.

## 2020-10-04 ENCOUNTER — Other Ambulatory Visit: Payer: Self-pay | Admitting: Family Medicine

## 2020-10-04 DIAGNOSIS — G4700 Insomnia, unspecified: Secondary | ICD-10-CM

## 2020-10-05 NOTE — Telephone Encounter (Signed)
[facility] is requesting to fill pt zolpidem. Please advise [facility]

## 2020-11-08 ENCOUNTER — Other Ambulatory Visit: Payer: Self-pay

## 2020-11-08 ENCOUNTER — Ambulatory Visit: Payer: BC Managed Care – PPO | Admitting: Family Medicine

## 2020-11-08 VITALS — BP 120/74 | HR 97 | Temp 97.6°F | Wt 239.6 lb

## 2020-11-08 DIAGNOSIS — M545 Low back pain, unspecified: Secondary | ICD-10-CM

## 2020-11-08 DIAGNOSIS — Z23 Encounter for immunization: Secondary | ICD-10-CM

## 2020-11-08 NOTE — Progress Notes (Signed)
   Subjective:    Patient ID: Thomas Blankenship, male    DOB: October 06, 1960, 60 y.o.   MRN: [id number]  HPI He states that he started having low back pain on the left-hand side approximately 1 month ago.  It did get 90% better until about a week ago when he had a reoccurrence.  No history of injury or overuse.  He does state that the pain does radiate down his left leg.  Presently he is least 80% better.   Review of Systems     Objective:   Physical Exam Alert and in no distress.  No palpable tenderness over the lumbar or SI joint area.  Normal lumbar motion.  Negative straight leg raising.  Normal DTRs and strength.       Assessment & Plan:  Acute left-sided low back pain without sciatica  Need for influenza vaccination - Plan: Flu Vaccine QUAD 47mo+IM (Fluarix, Fluzone & Alfiuria Quad PF) I explained that since he is getting better on his own, no further intervention needed.  Explained that nothing at this point towards towards pinched nerve although some of the symptoms could have been that.  Conservative care is definitely way to go.  He was comfortable with that.

## 2020-11-09 ENCOUNTER — Encounter: Payer: Self-pay | Admitting: Gastroenterology

## 2020-11-14 ENCOUNTER — Other Ambulatory Visit: Payer: Self-pay | Admitting: Family Medicine

## 2020-11-14 DIAGNOSIS — E291 Testicular hypofunction: Secondary | ICD-10-CM

## 2021-01-06 ENCOUNTER — Other Ambulatory Visit: Payer: Self-pay | Admitting: Family Medicine

## 2021-01-06 DIAGNOSIS — G4700 Insomnia, unspecified: Secondary | ICD-10-CM

## 2021-01-06 NOTE — Telephone Encounter (Signed)
[facility] is requesting to fill pt zolpidem. Please advise. [place]

## 2021-01-24 ENCOUNTER — Other Ambulatory Visit: Payer: Self-pay | Admitting: Family Medicine

## 2021-01-24 DIAGNOSIS — E291 Testicular hypofunction: Secondary | ICD-10-CM

## 2021-01-25 NOTE — Telephone Encounter (Signed)
[facility] is requesting to fill pt testosterone. Please advise [facility] 

## 2021-01-25 NOTE — Telephone Encounter (Signed)
Pt coming in 02/02/20. [place]

## 2021-02-01 ENCOUNTER — Other Ambulatory Visit: Payer: Self-pay

## 2021-02-01 ENCOUNTER — Encounter: Payer: Self-pay | Admitting: Family Medicine

## 2021-02-01 ENCOUNTER — Ambulatory Visit: Payer: BC Managed Care – PPO | Admitting: Family Medicine

## 2021-02-01 VITALS — BP 118/80 | HR 86 | Temp 97.1°F | Wt 235.6 lb

## 2021-02-01 DIAGNOSIS — Z125 Encounter for screening for malignant neoplasm of prostate: Secondary | ICD-10-CM

## 2021-02-01 DIAGNOSIS — E669 Obesity, unspecified: Secondary | ICD-10-CM

## 2021-02-01 DIAGNOSIS — G4730 Sleep apnea, unspecified: Secondary | ICD-10-CM

## 2021-02-01 DIAGNOSIS — J309 Allergic rhinitis, unspecified: Secondary | ICD-10-CM

## 2021-02-01 DIAGNOSIS — G4700 Insomnia, unspecified: Secondary | ICD-10-CM | POA: Diagnosis not present

## 2021-02-01 DIAGNOSIS — F341 Dysthymic disorder: Secondary | ICD-10-CM

## 2021-02-01 DIAGNOSIS — E291 Testicular hypofunction: Secondary | ICD-10-CM

## 2021-02-01 DIAGNOSIS — G4733 Obstructive sleep apnea (adult) (pediatric): Secondary | ICD-10-CM

## 2021-02-01 DIAGNOSIS — Z9989 Dependence on other enabling machines and devices: Secondary | ICD-10-CM

## 2021-02-01 DIAGNOSIS — I10 Essential (primary) hypertension: Secondary | ICD-10-CM

## 2021-02-01 MED ORDER — AZELASTINE HCL 137 MCG/SPRAY NA SOLN: NASAL | 3 refills | Status: DC

## 2021-02-01 MED ORDER — VENLAFAXINE HCL ER 150 MG PO CP24: 150.0000 mg | ORAL_CAPSULE | Freq: Two times a day (BID) | ORAL | 3 refills | Status: DC

## 2021-02-01 MED ORDER — LISINOPRIL-HYDROCHLOROTHIAZIDE 10-12.5 MG PO TABS: 1.0000 | ORAL_TABLET | Freq: Every day | ORAL | 3 refills | Status: DC

## 2021-02-01 MED ORDER — MONTELUKAST SODIUM 10 MG PO TABS: 10.0000 mg | ORAL_TABLET | Freq: Every day | ORAL | 3 refills | Status: DC

## 2021-02-01 MED ORDER — TESTOSTERONE 1.62 % TD GEL: TRANSDERMAL | 5 refills | Status: DC

## 2021-02-01 NOTE — Progress Notes (Signed)
° °  Subjective:    Patient ID: Thomas Blankenship, male    DOB: 08-26-1960, 61 y.o.   MRN: [id number]  HPI He is here for an interval evaluation.  He continues on 3 pumps of testosterone daily and is doing well with that.  He does have OSA and periodically looks at the readout and has good response.  His allergies are under good control on his present medication regimen.  Continues on Effexor and is very happy with this.  Continues on lisinopril/HCTZ without difficulty.   Review of Systems     Objective:   Physical Exam Alert and in no distress. Tympanic membranes and canals are normal. Pharyngeal area is normal. Neck is supple without adenopathy or thyromegaly. Cardiac exam shows a regular sinus rhythm without murmurs or gallops. Lungs are clear to auscultation.        Assessment & Plan:  Hypogonadism male - Plan: CBC with Differential/Platelet, Comprehensive metabolic panel, Testosterone, Testosterone 1.62 % GEL  Dysthymia - Plan: venlafaxine XR (EFFEXOR-XR) 150 MG 24 hr capsule  Allergic rhinitis, mild - Plan: montelukast (SINGULAIR) 10 MG tablet, Azelastine HCl 137 MCG/SPRAY SOLN  Insomnia w/ sleep apnea  Primary hypertension - Plan: CBC with Differential/Platelet, Comprehensive metabolic panel  OSA on CPAP  Obesity without serious comorbidity, unspecified classification, unspecified obesity type - Plan: CBC with Differential/Platelet, Comprehensive metabolic panel, Lipid panel  Screening for prostate cancer - Plan: PSA  Essential hypertension - Plan: lisinopril-hydrochlorothiazide (ZESTORETIC) 10-12.5 MG tablet

## 2021-02-02 LAB — CBC WITH DIFFERENTIAL/PLATELET
Basophils Absolute: 0.1 10*3/uL (ref 0.0–0.2)
Basos: 1 %
EOS (ABSOLUTE): 0.2 10*3/uL (ref 0.0–0.4)
Eos: 3 %
Hematocrit: 54 % — ABNORMAL HIGH (ref 37.5–51.0)
Hemoglobin: 17.7 g/dL (ref 13.0–17.7)
Immature Grans (Abs): 0 10*3/uL (ref 0.0–0.1)
Immature Granulocytes: 0 %
Lymphocytes Absolute: 1.6 10*3/uL (ref 0.7–3.1)
Lymphs: 25 %
MCH: 29.4 pg (ref 26.6–33.0)
MCHC: 32.8 g/dL (ref 31.5–35.7)
MCV: 90 fL (ref 79–97)
Monocytes Absolute: 0.7 10*3/uL (ref 0.1–0.9)
Monocytes: 12 %
Neutrophils Absolute: 3.8 10*3/uL (ref 1.4–7.0)
Neutrophils: 59 %
Platelets: 333 10*3/uL (ref 150–450)
RBC: 6.03 x10E6/uL — ABNORMAL HIGH (ref 4.14–5.80)
RDW: 11.8 % (ref 11.6–15.4)
WBC: 6.4 10*3/uL (ref 3.4–10.8)

## 2021-02-02 LAB — TESTOSTERONE: Testosterone: 622 ng/dL (ref 264–916)

## 2021-02-02 LAB — COMPREHENSIVE METABOLIC PANEL
ALT: 21 IU/L (ref 0–44)
AST: 17 IU/L (ref 0–40)
Albumin/Globulin Ratio: 1.8 (ref 1.2–2.2)
Albumin: 4.3 g/dL (ref 3.8–4.9)
Alkaline Phosphatase: 97 IU/L (ref 44–121)
BUN/Creatinine Ratio: 14 (ref 9–20)
BUN: 18 mg/dL (ref 6–24)
Bilirubin Total: 0.5 mg/dL (ref 0.0–1.2)
CO2: 29 mmol/L (ref 20–29)
Calcium: 9.5 mg/dL (ref 8.7–10.2)
Chloride: 102 mmol/L (ref 96–106)
Creatinine, Ser: 1.28 mg/dL — ABNORMAL HIGH (ref 0.76–1.27)
Globulin, Total: 2.4 g/dL (ref 1.5–4.5)
Glucose: 87 mg/dL (ref 70–99)
Potassium: 4.8 mmol/L (ref 3.5–5.2)
Sodium: 144 mmol/L (ref 134–144)
Total Protein: 6.7 g/dL (ref 6.0–8.5)
eGFR: 65 mL/min/{1.73_m2} (ref >59)

## 2021-02-02 LAB — LIPID PANEL
Chol/HDL Ratio: 4 ratio (ref 0.0–5.0)
Cholesterol, Total: 201 mg/dL — ABNORMAL HIGH (ref 100–199)
HDL: 50 mg/dL (ref >39)
LDL Chol Calc (NIH): 134 mg/dL — ABNORMAL HIGH (ref 0–99)
Triglycerides: 97 mg/dL (ref 0–149)
VLDL Cholesterol Cal: 17 mg/dL (ref 5–40)

## 2021-02-02 LAB — PSA: Prostate Specific Ag, Serum: 1.3 ng/mL (ref 0.0–4.0)

## 2021-02-07 ENCOUNTER — Other Ambulatory Visit: Payer: Self-pay | Admitting: Family Medicine

## 2021-02-07 DIAGNOSIS — G4700 Insomnia, unspecified: Secondary | ICD-10-CM

## 2021-02-07 NOTE — Telephone Encounter (Signed)
[facility] is requesting to fill pt ambien.Please advise [facility]

## 2021-03-10 ENCOUNTER — Other Ambulatory Visit: Payer: Self-pay | Admitting: Family Medicine

## 2021-03-10 DIAGNOSIS — G4700 Insomnia, unspecified: Secondary | ICD-10-CM

## 2021-03-10 NOTE — Telephone Encounter (Signed)
[facility] is requesting to fill pt zolpidem. Please advise [facility]

## 2021-04-13 ENCOUNTER — Other Ambulatory Visit: Payer: Self-pay

## 2021-04-13 ENCOUNTER — Encounter: Payer: Self-pay | Admitting: Family Medicine

## 2021-04-13 ENCOUNTER — Ambulatory Visit: Payer: BC Managed Care – PPO | Admitting: Family Medicine

## 2021-04-13 VITALS — BP 120/78 | HR 77 | Temp 96.0°F | Ht 66.0 in | Wt 251.6 lb

## 2021-04-13 DIAGNOSIS — Z9989 Dependence on other enabling machines and devices: Secondary | ICD-10-CM | POA: Diagnosis not present

## 2021-04-13 DIAGNOSIS — G4733 Obstructive sleep apnea (adult) (pediatric): Secondary | ICD-10-CM | POA: Diagnosis not present

## 2021-04-13 NOTE — Progress Notes (Signed)
? ?  Subjective:  ? ? Patient ID: Thomas Blankenship, male    DOB: September 29, 1960, 61 y.o.   MRN: [id number] ? ?HPI ?He is here for consult concerning his OSA.  He has been on CPAP for quite some time and doing well on the auto titrate setting.  Apparently his machine is sending messages it is time for a new one.  He has had this machine for approximately 6 years. ? ? ?Review of Systems ? ?   ?Objective:  ? Physical Exam ?Alert and in no distress otherwise not examined ? ? ? ?   ?Assessment & Plan:  ?OSA on CPAP - Plan: For home use only DME continuous positive airway pressure (CPAP) ?I will send off to get a new machine.  Cautioned that we might need to do another sleep study depending upon when he had his last study done. ? ?

## 2021-04-21 ENCOUNTER — Ambulatory Visit: Payer: BC Managed Care – PPO | Admitting: Family Medicine

## 2021-04-21 ENCOUNTER — Other Ambulatory Visit: Payer: Self-pay

## 2021-04-21 ENCOUNTER — Encounter: Payer: Self-pay | Admitting: Family Medicine

## 2021-04-21 VITALS — BP 120/74 | HR 88 | Temp 96.6°F | Ht 66.0 in | Wt 250.6 lb

## 2021-04-21 DIAGNOSIS — F341 Dysthymic disorder: Secondary | ICD-10-CM | POA: Diagnosis not present

## 2021-04-21 DIAGNOSIS — G4733 Obstructive sleep apnea (adult) (pediatric): Secondary | ICD-10-CM | POA: Diagnosis not present

## 2021-04-21 DIAGNOSIS — E669 Obesity, unspecified: Secondary | ICD-10-CM

## 2021-04-21 DIAGNOSIS — E291 Testicular hypofunction: Secondary | ICD-10-CM

## 2021-04-21 DIAGNOSIS — J309 Allergic rhinitis, unspecified: Secondary | ICD-10-CM

## 2021-04-21 DIAGNOSIS — Z0000 Encounter for general adult medical examination without abnormal findings: Secondary | ICD-10-CM

## 2021-04-21 DIAGNOSIS — Z9989 Dependence on other enabling machines and devices: Secondary | ICD-10-CM

## 2021-04-21 NOTE — Progress Notes (Signed)
? ?  Subjective:  ? ? Patient ID: Thomas Blankenship, male    DOB: September 12, 1960, 61 y.o.   MRN: [id number] ? ?HPI ?He is here for complete examination.  He has underlying allergies and is using Astelin, Flomax and Singulair but still having difficulty.  He is in the process of trying to get a new CPAP machine.  Continues on 3 pumps of testosterone.  His most recent testosterone was in a good range however hematocrit was slightly elevated.  He continues on Effexor and is very comfortable with that.  His work and home life are unchanged.  He has no other concerns or complaints. ? ? ?Review of Systems  ?All other systems reviewed and are negative. ? ?   ?Objective:  ? Physical Exam ?Alert and in no distress. Tympanic membranes and canals are normal. Pharyngeal area is normal. Neck is supple without adenopathy or thyromegaly. Cardiac exam shows a regular sinus rhythm without murmurs or gallops. Lungs are clear to auscultation.  Abdominal shows no masses or tenderness. ? ? ? ? ?   ?Assessment & Plan:  ?Routine general medical examination at a health care facility ? ?OSA on CPAP ? ?Hypogonadism male ? ?Dysthymia ? ?Allergic rhinitis, mild ? ?Obesity without serious comorbidity, unspecified classification, unspecified obesity type ?Recommend he add Claritin or Allegra to his regimen for the allergies.  Also encouraged him to call the DME facility to see the status of his CPAP machine.  He is to cut back on his testosterone alternating between 2 and 3 pumps.  Explained that the hematocrit is slightly elevated and we need to be cognizant of that.  Continue on his Effexor. ?Discussed shingles vaccine at some point in the future. ?

## 2021-06-11 ENCOUNTER — Other Ambulatory Visit: Payer: Self-pay | Admitting: Family Medicine

## 2021-06-11 DIAGNOSIS — G4700 Insomnia, unspecified: Secondary | ICD-10-CM

## 2021-06-13 NOTE — Telephone Encounter (Signed)
[facility] is requesting to fill pt zolpidem. Please advise [place] ?

## 2021-08-03 ENCOUNTER — Ambulatory Visit: Payer: BC Managed Care – PPO | Admitting: Family Medicine

## 2021-08-03 VITALS — BP 120/70 | HR 91 | Temp 97.0°F | Wt 252.4 lb

## 2021-08-03 DIAGNOSIS — F341 Dysthymic disorder: Secondary | ICD-10-CM

## 2021-08-03 DIAGNOSIS — J309 Allergic rhinitis, unspecified: Secondary | ICD-10-CM

## 2021-08-03 DIAGNOSIS — Z9989 Dependence on other enabling machines and devices: Secondary | ICD-10-CM

## 2021-08-03 DIAGNOSIS — E291 Testicular hypofunction: Secondary | ICD-10-CM | POA: Diagnosis not present

## 2021-08-03 DIAGNOSIS — G4733 Obstructive sleep apnea (adult) (pediatric): Secondary | ICD-10-CM

## 2021-08-03 NOTE — Progress Notes (Signed)
Subjective:  Thomas Blankenship is a 61 y.o. male who presents for Chief Complaint  Patient presents with   Follow-up     depression  He states that he is feeling somewhat better but is vague as to how well he is feeling.  He is using his CPAP and is concerned that it is not working properly however discussion with him indicates his AHI is in the 3-4 range.  He is sleeping on his side and does find this to be more useful.  Continues on his testosterone.  He continues on his allergy medications and does complain of some slight nasal congestion.     The following portions of the patient's history were reviewed and updated as appropriate: allergies, current medications, past family history, past medical history, past social history, past surgical history and problem list.  ROS Otherwise as in subjective above  Objective: Alert and in no distress with appropriate affect and dressed appropriately. Anxiety, Follow-up  He was last seen for anxiety 3 months ago. He reports excellent compliance with treatment. He reports good tolerance of treatment. He is not having side effects.   He feels his anxiety is mild and Improved since last visit.   Symptoms: No chest pain No difficulty concentrating  No dizziness No fatigue  No feelings of losing control No insomnia  No irritable No palpitations  No panic attacks No racing thoughts  No shortness of breath No sweating  No tremors/shakes    GAD-7 Results    08/03/2021   11:22 AM  GAD-7 Generalized Anxiety Disorder Screening Tool  1. Feeling Nervous, Anxious, or on Edge 0  2. Not Being Able to Stop or Control Worrying 0  3. Worrying Too Much About Different Things 0  4. Trouble Relaxing 1  5. Being So Restless it's Hard To Sit Still 0  6. Becoming Easily Annoyed or Irritable 0  7. Feeling Afraid As If Something Awful Might Happen 1  Total GAD-7 Score 2  Difficulty At Work, Home, or Getting  Along With Others? Not difficult at all     PHQ-9 Scores    08/03/2021   11:19 AM 04/21/2021    2:17 PM 04/13/2021    9:18 AM  PHQ9 SCORE ONLY  PHQ-9 Total Score '1 2 2    '$ ---------------------------------------------------------------------------------------------------   Assessment: Encounter Diagnoses  Name Primary?   OSA on CPAP Yes   Hypogonadism male    Dysthymia      Plan: Continue on present medication regimen.  Discussed possibly going higher but he is not interested.  He will work with trying to get better adjustment of his CPAP although it does seem to be working fairly well.  I will check a testosterone on him today to make sure that is not contributing.  We will continue on his allergy medications.  Amad was seen today for follow-up.  Diagnoses and all orders for this visit:  OSA on CPAP  Hypogonadism male -     Testosterone  Dysthymia    Follow up: 4 months.

## 2021-08-04 LAB — TESTOSTERONE: Testosterone: 395 ng/dL (ref 264–916)

## 2021-09-13 ENCOUNTER — Other Ambulatory Visit: Payer: Self-pay | Admitting: Family Medicine

## 2021-09-13 DIAGNOSIS — G4700 Insomnia, unspecified: Secondary | ICD-10-CM

## 2021-09-13 NOTE — Telephone Encounter (Signed)
Refill zolpidem 08/03/21 next apt 04/27/22.

## 2021-09-22 ENCOUNTER — Other Ambulatory Visit: Payer: Self-pay | Admitting: Family Medicine

## 2021-09-22 DIAGNOSIS — E291 Testicular hypofunction: Secondary | ICD-10-CM

## 2021-09-22 NOTE — Telephone Encounter (Signed)
[facility] is requesting to fill pt testosterone. Please advise [facility]

## 2021-10-05 ENCOUNTER — Encounter: Payer: Self-pay | Admitting: Internal Medicine

## 2021-10-26 ENCOUNTER — Other Ambulatory Visit: Payer: Self-pay | Admitting: Family Medicine

## 2021-10-26 DIAGNOSIS — E291 Testicular hypofunction: Secondary | ICD-10-CM

## 2021-10-26 NOTE — Telephone Encounter (Signed)
[facility] is requesting to fill pt testosterone. Please advise [facility]

## 2021-11-21 ENCOUNTER — Encounter: Payer: Self-pay | Admitting: Internal Medicine

## 2021-12-13 ENCOUNTER — Other Ambulatory Visit: Payer: Self-pay | Admitting: Family Medicine

## 2021-12-13 DIAGNOSIS — G4700 Insomnia, unspecified: Secondary | ICD-10-CM

## 2021-12-13 NOTE — Telephone Encounter (Signed)
Is this okay to refill? 

## 2022-02-07 ENCOUNTER — Encounter: Payer: Self-pay | Admitting: Internal Medicine

## 2022-02-21 ENCOUNTER — Other Ambulatory Visit: Payer: Self-pay | Admitting: Family Medicine

## 2022-02-21 DIAGNOSIS — E291 Testicular hypofunction: Secondary | ICD-10-CM

## 2022-02-21 NOTE — Telephone Encounter (Signed)
Is it ok to refill?

## 2022-02-22 NOTE — Telephone Encounter (Signed)
Patient has a CPE appointment scheduled for 04/27/2022. Would you like to see him sooner?

## 2022-03-14 ENCOUNTER — Telehealth: Payer: Self-pay | Admitting: Family Medicine

## 2022-03-14 DIAGNOSIS — G4700 Insomnia, unspecified: Secondary | ICD-10-CM

## 2022-03-14 MED ORDER — ZOLPIDEM TARTRATE ER 12.5 MG PO TBCR
12.5000 mg | EXTENDED_RELEASE_TABLET | Freq: Every evening | ORAL | 0 refills | Status: DC | PRN
Start: 2022-03-14 — End: 2022-03-22

## 2022-03-14 NOTE — Telephone Encounter (Signed)
Pt has a cpe on 04/27/2022. Needs refills on Ambien CR 12.5. Please send to [facility] on [address]. Pt can be reached at [phone].

## 2022-03-21 ENCOUNTER — Telehealth: Payer: Self-pay | Admitting: Family Medicine

## 2022-03-21 DIAGNOSIS — G4700 Insomnia, unspecified: Secondary | ICD-10-CM

## 2022-03-21 NOTE — Telephone Encounter (Signed)
Thomas Blankenship left message that [facility] does not have the Ambien 12.5 CR The [facility] does.  Please send there.

## 2022-03-22 MED ORDER — ZOLPIDEM TARTRATE ER 12.5 MG PO TBCR: 12.5000 mg | EXTENDED_RELEASE_TABLET | Freq: Every evening | ORAL | 0 refills | Status: DC | PRN

## 2022-03-28 ENCOUNTER — Other Ambulatory Visit: Payer: Self-pay | Admitting: Family Medicine

## 2022-03-28 DIAGNOSIS — I10 Essential (primary) hypertension: Secondary | ICD-10-CM

## 2022-04-09 ENCOUNTER — Other Ambulatory Visit: Payer: Self-pay | Admitting: Family Medicine

## 2022-04-09 DIAGNOSIS — F341 Dysthymic disorder: Secondary | ICD-10-CM

## 2022-04-27 ENCOUNTER — Encounter: Payer: BC Managed Care – PPO | Admitting: Family Medicine

## 2022-04-27 DIAGNOSIS — J309 Allergic rhinitis, unspecified: Secondary | ICD-10-CM

## 2022-04-27 DIAGNOSIS — E669 Obesity, unspecified: Secondary | ICD-10-CM

## 2022-04-27 DIAGNOSIS — G4700 Insomnia, unspecified: Secondary | ICD-10-CM

## 2022-04-27 DIAGNOSIS — F341 Dysthymic disorder: Secondary | ICD-10-CM

## 2022-04-27 DIAGNOSIS — Z0000 Encounter for general adult medical examination without abnormal findings: Secondary | ICD-10-CM

## 2022-04-27 DIAGNOSIS — D126 Benign neoplasm of colon, unspecified: Secondary | ICD-10-CM

## 2022-04-27 DIAGNOSIS — E291 Testicular hypofunction: Secondary | ICD-10-CM

## 2022-04-27 DIAGNOSIS — G4733 Obstructive sleep apnea (adult) (pediatric): Secondary | ICD-10-CM

## 2022-04-27 DIAGNOSIS — I10 Essential (primary) hypertension: Secondary | ICD-10-CM

## 2022-04-27 DIAGNOSIS — Z23 Encounter for immunization: Secondary | ICD-10-CM

## 2022-06-06 ENCOUNTER — Other Ambulatory Visit: Payer: Self-pay | Admitting: Family Medicine

## 2022-06-06 DIAGNOSIS — J309 Allergic rhinitis, unspecified: Secondary | ICD-10-CM

## 2022-06-20 ENCOUNTER — Other Ambulatory Visit: Payer: Self-pay | Admitting: Medical

## 2022-06-20 DIAGNOSIS — G4700 Insomnia, unspecified: Secondary | ICD-10-CM

## 2022-07-03 ENCOUNTER — Other Ambulatory Visit: Payer: Self-pay | Admitting: Family Medicine

## 2022-07-03 DIAGNOSIS — F341 Dysthymic disorder: Secondary | ICD-10-CM

## 2022-07-03 NOTE — Telephone Encounter (Signed)
Refill request last apt 08/03/21 next apt 08/24/22.

## 2022-07-17 ENCOUNTER — Other Ambulatory Visit: Payer: Self-pay | Admitting: Family Medicine

## 2022-07-17 ENCOUNTER — Other Ambulatory Visit: Payer: Self-pay

## 2022-07-17 DIAGNOSIS — I10 Essential (primary) hypertension: Secondary | ICD-10-CM

## 2022-07-17 MED ORDER — LISINOPRIL-HYDROCHLOROTHIAZIDE 10-12.5 MG PO TABS
1.0000 | ORAL_TABLET | Freq: Every day | ORAL | 1 refills | Status: DC
Start: 2022-07-17 — End: 2022-08-24

## 2022-07-21 ENCOUNTER — Other Ambulatory Visit: Payer: Self-pay | Admitting: Medical

## 2022-07-21 DIAGNOSIS — G4730 Sleep apnea, unspecified: Secondary | ICD-10-CM

## 2022-07-21 NOTE — Telephone Encounter (Signed)
Refill request last apt 08/01/21 next apt 08/24/22

## 2022-07-27 ENCOUNTER — Other Ambulatory Visit: Payer: Self-pay | Admitting: Family Medicine

## 2022-07-27 DIAGNOSIS — E291 Testicular hypofunction: Secondary | ICD-10-CM

## 2022-07-27 NOTE — Telephone Encounter (Signed)
Is it ok to refill? 

## 2022-08-10 ENCOUNTER — Encounter: Payer: Self-pay | Admitting: Gastroenterology

## 2022-08-22 ENCOUNTER — Other Ambulatory Visit: Payer: Self-pay | Admitting: Family Medicine

## 2022-08-22 DIAGNOSIS — G4700 Insomnia, unspecified: Secondary | ICD-10-CM

## 2022-08-24 ENCOUNTER — Encounter: Payer: Self-pay | Admitting: Family Medicine

## 2022-08-24 ENCOUNTER — Ambulatory Visit: Payer: BC Managed Care – PPO | Admitting: Family Medicine

## 2022-08-24 VITALS — BP 120/80 | HR 81 | Temp 98.0°F | Resp 18 | Ht 66.0 in | Wt 260.4 lb

## 2022-08-24 DIAGNOSIS — E669 Obesity, unspecified: Secondary | ICD-10-CM

## 2022-08-24 DIAGNOSIS — Z0000 Encounter for general adult medical examination without abnormal findings: Secondary | ICD-10-CM | POA: Diagnosis not present

## 2022-08-24 DIAGNOSIS — Z23 Encounter for immunization: Secondary | ICD-10-CM | POA: Diagnosis not present

## 2022-08-24 DIAGNOSIS — I10 Essential (primary) hypertension: Secondary | ICD-10-CM

## 2022-08-24 DIAGNOSIS — G4733 Obstructive sleep apnea (adult) (pediatric): Secondary | ICD-10-CM

## 2022-08-24 DIAGNOSIS — D126 Benign neoplasm of colon, unspecified: Secondary | ICD-10-CM | POA: Diagnosis not present

## 2022-08-24 DIAGNOSIS — J309 Allergic rhinitis, unspecified: Secondary | ICD-10-CM | POA: Diagnosis not present

## 2022-08-24 DIAGNOSIS — G4700 Insomnia, unspecified: Secondary | ICD-10-CM

## 2022-08-24 DIAGNOSIS — F341 Dysthymic disorder: Secondary | ICD-10-CM | POA: Diagnosis not present

## 2022-08-24 DIAGNOSIS — G4730 Sleep apnea, unspecified: Secondary | ICD-10-CM

## 2022-08-24 DIAGNOSIS — E291 Testicular hypofunction: Secondary | ICD-10-CM

## 2022-08-24 DIAGNOSIS — Z125 Encounter for screening for malignant neoplasm of prostate: Secondary | ICD-10-CM

## 2022-08-24 MED ORDER — ZOLPIDEM TARTRATE ER 12.5 MG PO TBCR
12.5000 mg | EXTENDED_RELEASE_TABLET | Freq: Every evening | ORAL | 1 refills | Status: DC | PRN
Start: 2022-08-24 — End: 2023-03-12

## 2022-08-24 MED ORDER — LISINOPRIL-HYDROCHLOROTHIAZIDE 10-12.5 MG PO TABS
1.0000 | ORAL_TABLET | Freq: Every day | ORAL | 3 refills | Status: DC
Start: 2022-08-24 — End: 2023-05-03

## 2022-08-24 MED ORDER — MONTELUKAST SODIUM 10 MG PO TABS
10.0000 mg | ORAL_TABLET | Freq: Every day | ORAL | 3 refills | Status: DC
Start: 2022-08-24 — End: 2023-05-03

## 2022-08-24 MED ORDER — VENLAFAXINE HCL ER 150 MG PO CP24
150.0000 mg | ORAL_CAPSULE | Freq: Two times a day (BID) | ORAL | 3 refills | Status: DC
Start: 2022-08-24 — End: 2023-05-03

## 2022-08-24 MED ORDER — TESTOSTERONE 1.62 % TD GEL
TRANSDERMAL | 1 refills | Status: DC
Start: 2022-08-24 — End: 2023-02-26

## 2022-08-24 MED ORDER — AZELASTINE HCL 137 MCG/SPRAY NA SOLN
NASAL | 3 refills | Status: DC
Start: 2022-08-24 — End: 2023-05-03

## 2022-08-24 NOTE — Progress Notes (Signed)
Complete physical exam  Patient: Thomas Blankenship   DOB: 1960-08-28   62 y.o. Male  MRN: [id number]  Subjective:    Chief Complaint  Patient presents with   Annual Exam    Fasting. No additional concerns.     Thomas Blankenship is a 62 y.o. male who presents today for a complete physical exam.  He reports consuming a general diet. Home exercise routine includes walking . He generally feels well. He reports sleeping fairly well.  He does have underlying allergies and has him under good control on his present medication regimen.  He continues on Effexor and continues to do quite nicely on that.  He is using testosterone and notes that it does help with energy and stamina.  He has OSA and does use Ambien to help with his sleep.  Continues on lisinopril/HCTZ.  His weight is unchanged.  Review of record indicates need for follow-up colonoscopy.  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  Most recent fall risk assessment:    08/24/2022    3:49 PM  Fall Risk   Falls in the past year? 0  Number falls in past yr: 0  Injury with Fall? 0  Risk for fall due to : No Fall Risks  Follow up Falls evaluation completed     Most recent depression screenings:    08/24/2022    3:49 PM 08/03/2021   11:19 AM  PHQ 2/9 Scores  PHQ - 2 Score 2 0  PHQ- 9 Score 4 1    Vision:Not within last year  and Dental: Receives regular dental care    Immunization History  Administered Date(s) Administered   Influenza Split 10/25/2009, 10/09/2011   Influenza Whole 10/19/2009, 09/22/2010   Influenza,inj,Quad PF,6+ Mos 10/02/2012, 09/25/2013, 12/10/2014, 10/18/2017, 10/15/2018, 11/08/2020   Influenza-Unspecified 12/27/2019, 12/03/2021   Moderna Sars-Covid-2 Vaccination 04/10/2019, 05/13/2019, 12/27/2019   Pfizer Covid-19 Vaccine Bivalent Booster 54yrs & up 01/18/2021   Tdap 01/09/2013    Health Maintenance  Topic Date Due   HIV Screening  Never done   Zoster Vaccines- Shingrix (1 of 2) Never done    COVID-19 Vaccine (5 - 2023-24 season) 09/30/2021   Colonoscopy  10/21/2022   INFLUENZA VACCINE  08/31/2022   DTaP/Tdap/Td (2 - Td or Tdap) 01/10/2023   Hepatitis C Screening  Completed   HPV VACCINES  Aged Out    Patient Care Team: Ronnald Nian, MD as PCP - General (Family Medicine)   Outpatient Medications Prior to Visit  Medication Sig   fluticasone (FLONASE) 50 MCG/ACT nasal spray USE TWO SPRAY(S) IN EACH NOSTRIL ONCE DAILY   [DISCONTINUED] Azelastine HCl 137 MCG/SPRAY SOLN 2 sprays in each nostril twice per day   [DISCONTINUED] lisinopril-hydrochlorothiazide (ZESTORETIC) 10-12.5 MG tablet Take 1 tablet by mouth daily.   [DISCONTINUED] montelukast (SINGULAIR) 10 MG tablet TAKE 1 TABLET BY MOUTH AT BEDTIME   [DISCONTINUED] Testosterone 1.62 % GEL APPLY 3 PUMPS TOPICALLY ONCE DAILY   [DISCONTINUED] venlafaxine XR (EFFEXOR-XR) 150 MG 24 hr capsule Take 1 capsule by mouth twice daily   [DISCONTINUED] zolpidem (AMBIEN CR) 12.5 MG CR tablet TAKE 1 TABLET BY MOUTH AT BEDTIME AS NEEDED   Facility-Administered Medications Prior to Visit  Medication Dose Route Frequency Provider   0.9 %  sodium chloride infusion  500 mL Intravenous Continuous Danis, Andreas Blower, MD    Review of Systems  All other systems reviewed and are negative.   Family and social history as well as  health maintenance and immunizations was reviewed.     Objective:    BP 120/80   Pulse 81   Temp 98 F (36.7 C) (Oral)   Resp 18   Ht 5\' 6"  (1.676 m)   Wt 260 lb 6.4 oz (118.1 kg)   SpO2 93% Comment: room air  BMI 42.03 kg/m    Physical Exam   Alert and in no distress. Tympanic membranes and canals are normal. Pharyngeal area is normal. Neck is supple without adenopathy or thyromegaly. Cardiac exam shows a regular sinus rhythm without murmurs or gallops. Lungs are clear to auscultation.      Assessment & Plan:    Routine general medical examination at a health care facility - Plan: CBC with  Differential/Platelet, Comprehensive metabolic panel, Lipid panel  Adenomatous polyp of colon, unspecified part of colon - Plan: Ambulatory referral to Gastroenterology  Allergic rhinitis, mild - Plan: Azelastine HCl 137 MCG/SPRAY SOLN, montelukast (SINGULAIR) 10 MG tablet  Dysthymia - Plan: venlafaxine XR (EFFEXOR-XR) 150 MG 24 hr capsule  Primary hypertension - Plan: CBC with Differential/Platelet, Comprehensive metabolic panel  Hypogonadism male - Plan: Testosterone, Testosterone 1.62 % GEL  Insomnia w/ sleep apnea - Plan: zolpidem (AMBIEN CR) 12.5 MG CR tablet  Obesity without serious comorbidity, unspecified classification, unspecified obesity type  OSA on CPAP  Screening for prostate cancer - Plan: PSA  Essential hypertension - Plan: lisinopril-hydrochlorothiazide (ZESTORETIC) 10-12.5 MG tablet  Need for shingles vaccine - Plan: Varicella-zoster vaccine IM  Need for Tdap vaccination - Plan: Tdap vaccine greater than or equal to 7yo IM  Discussed health benefits of physical activity, and encouraged him to engage in regular exercise appropriate for his age and condition.  Follow-up 1 year.    Sharlot Gowda, MD

## 2022-08-25 LAB — COMPREHENSIVE METABOLIC PANEL: Sodium: 141 mmol/L (ref 134–144)

## 2022-08-25 LAB — CBC WITH DIFFERENTIAL/PLATELET: Platelets: 307 10*3/uL (ref 150–450)

## 2022-09-21 ENCOUNTER — Ambulatory Visit (AMBULATORY_SURGERY_CENTER): Payer: BC Managed Care – PPO

## 2022-09-21 VITALS — Ht 66.0 in | Wt 260.0 lb

## 2022-09-21 DIAGNOSIS — Z86010 Personal history of colonic polyps: Secondary | ICD-10-CM

## 2022-09-21 MED ORDER — NA SULFATE-K SULFATE-MG SULF 17.5-3.13-1.6 GM/177ML PO SOLN
1.0000 | Freq: Once | ORAL | 0 refills | Status: AC
Start: 2022-09-21 — End: 2022-09-21

## 2022-09-21 NOTE — Progress Notes (Signed)
No egg or soy allergy known to patient  No issues known to pt with past sedation with any surgeries or procedures Patient denies ever being told they had issues or difficulty with intubation  No FH of Malignant Hyperthermia Pt is not on diet pills Pt is not on  home 02  Pt is not on blood thinners  Pt denies issues with constipation  No A fib or A flutter Have any cardiac testing pending--no  LOA: independent  Prep: suprep   Patient's chart reviewed by Cathlyn Parsons CNRA prior to previsit and patient appropriate for the [facility].  Previsit completed and red dot placed by patient's name on their procedure day (on provider's schedule).     PV competed with patient. Prep instructions sent via mychart and home address. Goodrx coupon for [employer] and [facility] provided to use for price reduction if needed. Rx sent to [facility]

## 2022-10-03 ENCOUNTER — Encounter: Payer: Self-pay | Admitting: Gastroenterology

## 2022-10-19 ENCOUNTER — Ambulatory Visit (AMBULATORY_SURGERY_CENTER): Payer: BC Managed Care – PPO | Admitting: Gastroenterology

## 2022-10-19 ENCOUNTER — Encounter: Payer: Self-pay | Admitting: Gastroenterology

## 2022-10-19 VITALS — BP 121/68 | HR 79 | Temp 99.1°F | Resp 16 | Ht 66.0 in | Wt 260.0 lb

## 2022-10-19 DIAGNOSIS — D123 Benign neoplasm of transverse colon: Secondary | ICD-10-CM

## 2022-10-19 DIAGNOSIS — Z86010 Personal history of colonic polyps: Secondary | ICD-10-CM

## 2022-10-19 DIAGNOSIS — K635 Polyp of colon: Secondary | ICD-10-CM | POA: Diagnosis not present

## 2022-10-19 DIAGNOSIS — Z09 Encounter for follow-up examination after completed treatment for conditions other than malignant neoplasm: Secondary | ICD-10-CM | POA: Diagnosis present

## 2022-10-19 MED ORDER — SODIUM CHLORIDE 0.9 % IV SOLN: 500.0000 mL | Freq: Once | INTRAVENOUS | Status: DC

## 2022-10-19 NOTE — Progress Notes (Signed)
A/O x 3, gd SR's, VSS, report to RN

## 2022-10-19 NOTE — Progress Notes (Signed)
Called to room to assist during endoscopic procedure.  Patient ID and intended procedure confirmed with present staff. Received instructions for my participation in the procedure from the performing physician.  

## 2022-10-19 NOTE — Op Note (Signed)
[facility] Patient Name: Thomas Blankenship Procedure Date: 10/19/2022 11:23 AM MRN: [id number] Endoscopist: Sherilyn Cooter L. Myrtie Neither , MD, [id number] Age: 62 Referring MD:  Date of Birth: 02-13-1960 Gender: Male Account #: [id number] Procedure:                Colonoscopy Indications:              Surveillance: Personal history of adenomatous                            polyps on last colonoscopy > 5 years ago                           Diminutive tubular adenoma September 2017 Medicines:                Monitored Anesthesia Care Procedure:                Pre-Anesthesia Assessment:                           - Prior to the procedure, a History and Physical                            was performed, and patient medications and                            allergies were reviewed. The patient's tolerance of                            previous anesthesia was also reviewed. The risks                            and benefits of the procedure and the sedation                            options and risks were discussed with the patient.                            All questions were answered, and informed consent                            was obtained. Prior Anticoagulants: The patient has                            taken no anticoagulant or antiplatelet agents. ASA                            Grade Assessment: II - A patient with mild systemic                            disease. After reviewing the risks and benefits,                            the patient was deemed in satisfactory condition to  undergo the procedure.                           After obtaining informed consent, the colonoscope                            was passed under direct vision. Throughout the                            procedure, the patient's blood pressure, pulse, and                            oxygen saturations were monitored continuously. The                            Olympus Scope SN: [id number] was  introduced through                            the anus and advanced to the the cecum, identified                            by appendiceal orifice and ileocecal valve. The                            colonoscopy was somewhat difficult due to a                            redundant colon and significant looping. Successful                            completion of the procedure was aided by using                            manual pressure and straightening and shortening                            the scope to obtain bowel loop reduction. The                            patient tolerated the procedure well. The quality                            of the bowel preparation was good. The ileocecal                            valve, appendiceal orifice, and rectum were                            photographed. Scope In: 11:29:46 AM Scope Out: 11:43:56 AM Scope Withdrawal Time: 0 hours 9 minutes 39 seconds  Total Procedure Duration: 0 hours 14 minutes 10 seconds  Findings:                 The perianal and digital rectal examinations were  normal.                           Repeat examination of right colon under NBI                            performed.                           A 5 mm polyp was found in the transverse colon. The                            polyp was semi-sessile. The polyp was removed with                            a cold snare. Resection and retrieval were complete.                           Internal hemorrhoids were found.                           The exam was otherwise without abnormality on                            direct and retroflexion views. Complications:            No immediate complications. Estimated Blood Loss:     Estimated blood loss was minimal. Impression:               - One 5 mm polyp in the transverse colon, removed                            with a cold snare. Resected and retrieved.                           - Internal  hemorrhoids.                           - The examination was otherwise normal on direct                            and retroflexion views. Recommendation:           - Patient has a contact number available for                            emergencies. The signs and symptoms of potential                            delayed complications were discussed with the                            patient. Return to normal activities tomorrow.                            Written discharge instructions were provided to the  patient.                           - Resume previous diet.                           - Continue present medications.                           - Await pathology results.                           - Repeat colonoscopy is recommended for                            surveillance. The colonoscopy date will be                            determined after pathology results from today's                            exam become available for review. Doristine Shehan L. Myrtie Neither, MD 10/19/2022 11:48:11 AM This report has been signed electronically.

## 2022-10-19 NOTE — Progress Notes (Signed)
Pt's states no medical or surgical changes since previsit or office visit.

## 2022-10-19 NOTE — Progress Notes (Signed)
History and Physical:  This patient presents for endoscopic testing for: Encounter Diagnosis  Name Primary?   Personal history of colonic polyps Yes   Surveillance colonoscopy today. Diminutive TC TA and sigmoid HP Sept 2017 Patient denies chronic abdominal pain, rectal bleeding, constipation or diarrhea.   Patient is otherwise without complaints or active issues today.   Past Medical History: Past Medical History:  Diagnosis Date   Allergy    RHINITIS   Anxiety    Depression    Hyperlipidemia    Hypertension    Hypogonadism male    Insomnia    Male pattern baldness    Obesity    Sleep apnea    wears cpap     Past Surgical History: Past Surgical History:  Procedure Laterality Date   COLONOSCOPY  10/2015   Dr. Myrtie Neither III   NASAL SEPTUM SURGERY     JACOB, MD    Allergies: Allergies  Allergen Reactions   Asa Buff (Mag [Buffered Aspirin] Rash    Flushed and rash     Outpatient Meds: Current Outpatient Medications  Medication Sig Dispense Refill   Azelastine HCl 137 MCG/SPRAY SOLN 2 sprays in each nostril twice per day 90 mL 3   fluticasone (FLONASE) 50 MCG/ACT nasal spray USE TWO SPRAY(S) IN EACH NOSTRIL ONCE DAILY 16 g 5   lisinopril-hydrochlorothiazide (ZESTORETIC) 10-12.5 MG tablet Take 1 tablet by mouth daily. 90 tablet 3   montelukast (SINGULAIR) 10 MG tablet Take 1 tablet (10 mg total) by mouth at bedtime. 90 tablet 3   Testosterone 1.62 % GEL APPLY 3 PUMPS TOPICALLY ONCE DAILY 150 g 1   venlafaxine XR (EFFEXOR-XR) 150 MG 24 hr capsule Take 1 capsule (150 mg total) by mouth 2 (two) times daily. 180 capsule 3   zolpidem (AMBIEN CR) 12.5 MG CR tablet Take 1 tablet (12.5 mg total) by mouth at bedtime as needed. 90 tablet 1   Current Facility-Administered Medications  Medication Dose Route Frequency Provider Last Rate Last Admin   0.9 %  sodium chloride infusion  500 mL Intravenous Continuous Danis, Starr Lake III, MD       0.9 %  sodium chloride infusion  500  mL Intravenous Once Charlie Pitter III, MD          ___________________________________________________________________ Objective   Exam:  BP 132/78   Pulse 80   Temp 99.1 F (37.3 C)   Ht 5\' 6"  (1.676 m)   Wt 260 lb (117.9 kg)   SpO2 96%   BMI 41.97 kg/m   CV: regular , S1/S2 Resp: clear to auscultation bilaterally, normal RR and effort noted GI: soft, no tenderness, with active bowel sounds.   Assessment: Encounter Diagnosis  Name Primary?   Personal history of colonic polyps Yes     Plan: Colonoscopy   The benefits and risks of the planned procedure were described in detail with the patient or (when appropriate) their health care proxy.  Risks were outlined as including, but not limited to, bleeding, infection, perforation, adverse medication reaction leading to cardiac or pulmonary decompensation, pancreatitis (if ERCP).  The limitation of incomplete mucosal visualization was also discussed.  No guarantees or warranties were given.  The patient is appropriate for an endoscopic procedure in the ambulatory setting.   - Amada Jupiter, MD

## 2022-10-19 NOTE — Patient Instructions (Addendum)
Patient has a contact number available for                            emergencies. The signs and symptoms of potential                            delayed complications were discussed with the                            patient. Return to normal activities tomorrow.                            Written discharge instructions were provided to the                            patient.                           - Resume previous diet.                           - Continue present medications.                           - Await pathology results.                           - Repeat colonoscopy is recommended for                            surveillance. The colonoscopy date will be                            determined after pathology results from today's                            exam become available for review.  Handout on polyps given.     YOU HAD AN ENDOSCOPIC PROCEDURE TODAY AT THE [facility]:   Refer to the procedure report that was given to you for any specific questions about what was found during the examination.  If the procedure report does not answer your questions, please call your gastroenterologist to clarify.  If you requested that your care partner not be given the details of your procedure findings, then the procedure report has been included in a sealed envelope for you to review at your convenience later.  YOU SHOULD EXPECT: Some feelings of bloating in the abdomen. Passage of more gas than usual.  Walking can help get rid of the air that was put into your GI tract during the procedure and reduce the bloating. If you had a lower endoscopy (such as a colonoscopy or flexible sigmoidoscopy) you may notice spotting of blood in your stool or on the toilet paper. If you underwent a bowel prep for your procedure, you may not have a normal bowel movement for a few days.  Please Note:  You might notice some irritation and congestion in your nose or some drainage.  This is from the  oxygen used during your procedure.  There is no need for concern and it should clear up in a day or so.  SYMPTOMS TO REPORT IMMEDIATELY:  Following lower endoscopy (colonoscopy or flexible sigmoidoscopy):  Excessive amounts of blood in the stool  Significant tenderness or worsening of abdominal pains  Swelling of the abdomen that is new, acute  Fever of 100F or higher   For urgent or emergent issues, a gastroenterologist can be reached at any hour by calling [phone]. Do not use MyChart messaging for urgent concerns.    DIET:  We do recommend a small meal at first, but then you may proceed to your regular diet.  Drink plenty of fluids but you should avoid alcoholic beverages for 24 hours.  ACTIVITY:  You should plan to take it easy for the rest of today and you should NOT DRIVE or use heavy machinery until tomorrow (because of the sedation medicines used during the test).    FOLLOW UP: Our staff will call the number listed on your records the next business day following your procedure.  We will call around 7:15- 8:00 am to check on you and address any questions or concerns that you may have regarding the information given to you following your procedure. If we do not reach you, we will leave a message.     If any biopsies were taken you will be contacted by phone or by letter within the next 1-3 weeks.  Please call us at [phone] if you have not heard about the biopsies in 3 weeks.    SIGNATURES/CONFIDENTIALITY: You and/or your care partner have signed paperwork which will be entered into your electronic medical record.  These signatures attest to the fact that that the information above on your After Visit Summary has been reviewed and is understood.  Full responsibility of the confidentiality of this discharge information lies with you and/or your care-partner.

## 2022-10-23 ENCOUNTER — Telehealth: Payer: Self-pay

## 2022-10-23 LAB — SURGICAL PATHOLOGY

## 2022-10-23 NOTE — Telephone Encounter (Signed)
  Follow up Call-     10/19/2022   10:06 AM  Call back number  Post procedure Call Back phone  # [phone]  Permission to leave phone message Yes     Patient questions:  Do you have a fever, pain , or abdominal swelling? No. Pain Score  0 *  Have you tolerated food without any problems? Yes.    Have you been able to return to your normal activities? Yes.    Do you have any questions about your discharge instructions: Diet   No. Medications  No. Follow up visit  No.  Do you have questions or concerns about your Care? No.  Actions: * If pain score is 4 or above: No action needed, pain <4.

## 2022-10-24 ENCOUNTER — Encounter: Payer: Self-pay | Admitting: Gastroenterology

## 2022-12-30 ENCOUNTER — Telehealth: Payer: Self-pay | Admitting: Family Medicine

## 2022-12-30 NOTE — Telephone Encounter (Signed)
P.A. ZOLPIDEM CR

## 2023-01-06 NOTE — Telephone Encounter (Signed)
P.A. approved til 03/01/23, sent MyChart message

## 2023-01-10 NOTE — Telephone Encounter (Signed)
Called pharmacy & they state he picked up Rx yesterday & went thru ins for $2

## 2023-01-29 ENCOUNTER — Ambulatory Visit (HOSPITAL_BASED_OUTPATIENT_CLINIC_OR_DEPARTMENT_OTHER)
Admission: RE | Admit: 2023-01-29 | Discharge: 2023-01-29 | Disposition: A | Discharge status: Home / Self Care | Payer: BC Managed Care – PPO | Source: Ambulatory Visit | Attending: General Practice | Admitting: General Practice

## 2023-01-29 ENCOUNTER — Other Ambulatory Visit (HOSPITAL_BASED_OUTPATIENT_CLINIC_OR_DEPARTMENT_OTHER): Payer: Self-pay | Admitting: General Practice

## 2023-01-29 DIAGNOSIS — T17808A Unspecified foreign body in other parts of respiratory tract causing other injury, initial encounter: Secondary | ICD-10-CM

## 2023-02-24 ENCOUNTER — Other Ambulatory Visit: Payer: Self-pay | Admitting: Family Medicine

## 2023-02-24 DIAGNOSIS — E291 Testicular hypofunction: Secondary | ICD-10-CM

## 2023-02-26 NOTE — Telephone Encounter (Signed)
Called pt, no answer, left voicemail.

## 2023-02-27 ENCOUNTER — Telehealth: Payer: Self-pay | Admitting: Family Medicine

## 2023-02-27 DIAGNOSIS — E291 Testicular hypofunction: Secondary | ICD-10-CM

## 2023-02-27 MED ORDER — TESTOSTERONE 1.62 % TD GEL: TRANSDERMAL | 1 refills | Status: DC

## 2023-02-27 NOTE — Telephone Encounter (Signed)
Pt called and states his testosterone gel was sent in for him to do 3 pumps a day but that would have him running out by day 20 so he is wanting at least a supply that will last him 30 days.

## 2023-03-11 ENCOUNTER — Other Ambulatory Visit: Payer: Self-pay | Admitting: Family Medicine

## 2023-03-11 DIAGNOSIS — G4700 Insomnia, unspecified: Secondary | ICD-10-CM

## 2023-03-27 ENCOUNTER — Encounter: Payer: Self-pay | Admitting: Internal Medicine

## 2023-05-03 ENCOUNTER — Ambulatory Visit: Payer: Self-pay | Admitting: Family Medicine

## 2023-05-03 ENCOUNTER — Encounter: Payer: Self-pay | Admitting: Family Medicine

## 2023-05-03 VITALS — BP 130/80 | HR 92 | Wt 265.0 lb

## 2023-05-03 DIAGNOSIS — Z23 Encounter for immunization: Secondary | ICD-10-CM | POA: Diagnosis not present

## 2023-05-03 DIAGNOSIS — I10 Essential (primary) hypertension: Secondary | ICD-10-CM

## 2023-05-03 DIAGNOSIS — G4730 Sleep apnea, unspecified: Secondary | ICD-10-CM

## 2023-05-03 DIAGNOSIS — E6609 Other obesity due to excess calories: Secondary | ICD-10-CM

## 2023-05-03 DIAGNOSIS — F341 Dysthymic disorder: Secondary | ICD-10-CM | POA: Diagnosis not present

## 2023-05-03 DIAGNOSIS — J309 Allergic rhinitis, unspecified: Secondary | ICD-10-CM | POA: Diagnosis not present

## 2023-05-03 DIAGNOSIS — D126 Benign neoplasm of colon, unspecified: Secondary | ICD-10-CM

## 2023-05-03 DIAGNOSIS — E291 Testicular hypofunction: Secondary | ICD-10-CM

## 2023-05-03 DIAGNOSIS — Z6379 Other stressful life events affecting family and household: Secondary | ICD-10-CM

## 2023-05-03 DIAGNOSIS — G4700 Insomnia, unspecified: Secondary | ICD-10-CM

## 2023-05-03 DIAGNOSIS — Z79899 Other long term (current) drug therapy: Secondary | ICD-10-CM

## 2023-05-03 LAB — LIPID PANEL

## 2023-05-03 MED ORDER — LISINOPRIL-HYDROCHLOROTHIAZIDE 10-12.5 MG PO TABS: 1.0000 | ORAL_TABLET | Freq: Every day | ORAL | 3 refills | Status: AC

## 2023-05-03 MED ORDER — AZELASTINE HCL 137 MCG/SPRAY NA SOLN
NASAL | 3 refills | Status: AC
Start: 2023-05-03 — End: ?

## 2023-05-03 MED ORDER — MONTELUKAST SODIUM 10 MG PO TABS: 10.0000 mg | ORAL_TABLET | Freq: Every day | ORAL | 3 refills | Status: AC

## 2023-05-03 MED ORDER — TESTOSTERONE 1.62 % TD GEL: TRANSDERMAL | 1 refills | Status: DC

## 2023-05-03 MED ORDER — VENLAFAXINE HCL ER 150 MG PO CP24
150.0000 mg | ORAL_CAPSULE | Freq: Two times a day (BID) | ORAL | 3 refills | Status: AC
Start: 2023-05-03 — End: ?

## 2023-05-03 NOTE — Progress Notes (Unsigned)
   Subjective:    Patient ID: Thomas Blankenship, male    DOB: 1960/03/15, 63 y.o.   MRN: [id number]  HPI He is here for an interval evaluation.  He has had a colonoscopy which did show an adenomatous colonic polyp and is scheduled for 5-year follow-up on that.  His allergies are under good control.  He is using azelastine, Singulair and an OTC allergy med.  He continues on Ambien to help with his sleep as well as continue on his CPAP.  He keeps getting good readouts on the CPAP.  Continues on 3 pumps of testosterone per day.  Effexor still keeps him psychologically in a good spot.  Right now his brother who lives with him is starting to have memory issues.  Apparently he is being cared for at a community medical clinic.   Review of Systems     Objective:    Physical Exam Alert and in no distress. Tympanic membranes and canals are normal. Pharyngeal area is normal. Neck is supple without adenopathy or thyromegaly. Cardiac exam shows a regular sinus rhythm without murmurs or gallops. Lungs are clear to auscultation.        Assessment & Plan:  Allergic rhinitis, mild - Plan: Azelastine HCl 137 MCG/SPRAY SOLN, montelukast (SINGULAIR) 10 MG tablet  Dysthymia - Plan: venlafaxine XR (EFFEXOR-XR) 150 MG 24 hr capsule  Primary hypertension - Plan: CBC with Differential/Platelet, Comprehensive metabolic panel with GFR  Hypogonadism male - Plan: Testosterone, Testosterone 1.62 % GEL  Insomnia w/ sleep apnea  Obesity due to excess calories without serious comorbidity, unspecified class - Plan: CBC with Differential/Platelet, Comprehensive metabolic panel with GFR, Lipid panel  Adenomatous polyp of colon, unspecified part of colon  Polypharmacy  Encounter for long-term (current) use of medications  Need for vaccination against Streptococcus pneumoniae - Plan: Pneumococcal conjugate vaccine 20-valent (Prevnar 20)  Need for shingles vaccine - Plan: Varicella-zoster vaccine IM  Stress due to  illness of family member  Essential hypertension - Plan: lisinopril-hydrochlorothiazide (ZESTORETIC) 10-12.5 MG tablet Continue on present medication regimen. I then discussed the situation with his brother.  Encouraged him to go with him next time he goes to be seen and discuss his memory issues as well as getting his brother on [place].  Down the road if his memory continues to get worse, he will probably need to be placed in the facility and Medicaid would need to be involved at that point. Over 40 minutes spent discussing his medical issues as well as how to handle his brother.  Also there apparently are legal issues with a house that is in the family and getting the belongings at the previous tenants out of there.

## 2023-05-04 ENCOUNTER — Encounter: Payer: Self-pay | Admitting: Family Medicine

## 2023-05-04 LAB — LIPID PANEL
Cholesterol, Total: 193 mg/dL (ref 100–199)
HDL: 41 mg/dL (ref >39)
LDL CALC COMMENT:: 4.7 ratio (ref 0.0–5.0)
LDL Chol Calc (NIH): 116 mg/dL — ABNORMAL HIGH (ref 0–99)
Triglycerides: 208 mg/dL — ABNORMAL HIGH (ref 0–149)
VLDL Cholesterol Cal: 36 mg/dL (ref 5–40)

## 2023-05-04 LAB — CBC WITH DIFFERENTIAL/PLATELET
Basophils Absolute: 0.1 10*3/uL (ref 0.0–0.2)
Basos: 1 %
EOS (ABSOLUTE): 0.3 10*3/uL (ref 0.0–0.4)
Eos: 4 %
Hematocrit: 52.1 % — ABNORMAL HIGH (ref 37.5–51.0)
Hemoglobin: 17.3 g/dL (ref 13.0–17.7)
Immature Grans (Abs): 0 10*3/uL (ref 0.0–0.1)
Immature Granulocytes: 0 %
Lymphocytes Absolute: 1.9 10*3/uL (ref 0.7–3.1)
Lymphs: 25 %
MCH: 29.6 pg (ref 26.6–33.0)
MCHC: 33.2 g/dL (ref 31.5–35.7)
MCV: 89 fL (ref 79–97)
Monocytes Absolute: 0.8 10*3/uL (ref 0.1–0.9)
Monocytes: 10 %
Neutrophils Absolute: 4.6 10*3/uL (ref 1.4–7.0)
Neutrophils: 60 %
Platelets: 293 10*3/uL (ref 150–450)
RBC: 5.85 x10E6/uL — ABNORMAL HIGH (ref 4.14–5.80)
RDW: 12.8 % (ref 11.6–15.4)
WBC: 7.6 10*3/uL (ref 3.4–10.8)

## 2023-05-04 LAB — COMPREHENSIVE METABOLIC PANEL WITH GFR
ALT: 17 IU/L (ref 0–44)
AST: 16 IU/L (ref 0–40)
Albumin: 4.1 g/dL (ref 3.8–4.9)
Alkaline Phosphatase: 127 IU/L — ABNORMAL HIGH (ref 44–121)
BUN/Creatinine Ratio: 11 (ref 10–24)
BUN: 14 mg/dL (ref 8–27)
Bilirubin Total: 0.5 mg/dL (ref 0.0–1.2)
CO2: 26 mmol/L (ref 20–29)
Calcium: 9.3 mg/dL (ref 8.6–10.2)
Chloride: 100 mmol/L (ref 96–106)
Creatinine, Ser: 1.28 mg/dL — ABNORMAL HIGH (ref 0.76–1.27)
Globulin, Total: 2.8 g/dL (ref 1.5–4.5)
Glucose: 94 mg/dL (ref 70–99)
Potassium: 4.3 mmol/L (ref 3.5–5.2)
Sodium: 141 mmol/L (ref 134–144)
Total Protein: 6.9 g/dL (ref 6.0–8.5)
eGFR: 64 mL/min/{1.73_m2} (ref >59)

## 2023-05-04 LAB — TESTOSTERONE: Testosterone: 792 ng/dL (ref 264–916)

## 2023-09-08 ENCOUNTER — Other Ambulatory Visit: Payer: Self-pay | Admitting: Family Medicine

## 2023-09-08 DIAGNOSIS — G4700 Insomnia, unspecified: Secondary | ICD-10-CM

## 2023-09-10 ENCOUNTER — Emergency Department (HOSPITAL_BASED_OUTPATIENT_CLINIC_OR_DEPARTMENT_OTHER)

## 2023-09-10 ENCOUNTER — Encounter (HOSPITAL_BASED_OUTPATIENT_CLINIC_OR_DEPARTMENT_OTHER): Payer: Self-pay | Admitting: Emergency Medicine

## 2023-09-10 ENCOUNTER — Other Ambulatory Visit: Payer: Self-pay

## 2023-09-10 ENCOUNTER — Ambulatory Visit: Payer: Self-pay

## 2023-09-10 ENCOUNTER — Emergency Department (HOSPITAL_BASED_OUTPATIENT_CLINIC_OR_DEPARTMENT_OTHER)
Admission: EM | Admit: 2023-09-10 | Discharge: 2023-09-10 | Disposition: A | Discharge status: Home / Self Care | Source: Ambulatory Visit | Attending: Emergency Medicine | Admitting: Emergency Medicine

## 2023-09-10 DIAGNOSIS — M79662 Pain in left lower leg: Secondary | ICD-10-CM

## 2023-09-10 DIAGNOSIS — M79605 Pain in left leg: Secondary | ICD-10-CM | POA: Insufficient documentation

## 2023-09-10 MED ORDER — CELECOXIB 200 MG PO CAPS: 200.0000 mg | ORAL_CAPSULE | Freq: Two times a day (BID) | ORAL | 0 refills | Status: DC | PRN

## 2023-09-10 MED ORDER — CYCLOBENZAPRINE HCL 10 MG PO TABS: 10.0000 mg | ORAL_TABLET | Freq: Two times a day (BID) | ORAL | 0 refills | Status: AC | PRN

## 2023-09-10 NOTE — Telephone Encounter (Signed)
 Last apt 05/03/23.

## 2023-09-10 NOTE — ED Provider Notes (Signed)
 [place] [facility] DEPARTMENT AT [facility] Provider Note   CSN: [id number] Arrival date & time: 09/10/23  1150     Patient presents with: Leg Pain   Thomas Blankenship is a 63 y.o. male.    Leg Pain   63 year old male presents emergency department complaints of left calf pain.  States that he has been having this since Saturday.  Woke up with this discomfort.  States it is worsened with walking and certain positions.  Denies any known falls/traumas.  Denies any history of DVT.  Was sent by primary care for DVT rule out.  Denies any chest pain, shortness of breath.  Past medical history significant for hypertension, hyperlipidemia, obesity, depression, anxiety  Prior to Admission medications   Medication Sig Start Date End Date Taking? Authorizing Provider  celecoxib  (CELEBREX ) 200 MG capsule Take 1 capsule (200 mg total) by mouth 2 (two) times daily as needed. 09/10/23  Yes Silver Fell A, PA  cyclobenzaprine  (FLEXERIL ) 10 MG tablet Take 1 tablet (10 mg total) by mouth 2 (two) times daily as needed for muscle spasms. 09/10/23  Yes Silver Fell DELENA, PA  Azelastine  HCl 137 MCG/SPRAY SOLN 2 sprays in each nostril twice per day 05/03/23   Lalonde, John C, MD  fluticasone  (FLONASE ) 50 MCG/ACT nasal spray USE TWO SPRAY(S) IN EACH NOSTRIL ONCE DAILY 05/25/15   Joyce Norleen BROCKS, MD  lisinopril -hydrochlorothiazide  (ZESTORETIC ) 10-12.5 MG tablet Take 1 tablet by mouth daily. 05/03/23   Joyce Norleen BROCKS, MD  montelukast  (SINGULAIR ) 10 MG tablet Take 1 tablet (10 mg total) by mouth at bedtime. 05/03/23   Joyce Norleen BROCKS, MD  Testosterone  1.62 % GEL APPLY 3 PUMPS TOPICALLY ONCE DAILY 05/03/23   Lalonde, John C, MD  venlafaxine  XR (EFFEXOR -XR) 150 MG 24 hr capsule Take 1 capsule (150 mg total) by mouth 2 (two) times daily. 05/03/23   Joyce Norleen BROCKS, MD  zolpidem  (AMBIEN  CR) 12.5 MG CR tablet TAKE 1 TABLET BY MOUTH AT BEDTIME AS NEEDED 03/12/23   Lalonde, John C, MD    Allergies: Asa buff (mag  [buffered aspirin]    Review of Systems  All other systems reviewed and are negative.   Updated Vital Signs BP 126/85   Pulse 92   Temp 98 F (36.7 C) (Oral)   Resp 16   Ht 5' 6 (1.676 m)   Wt 115.7 kg   SpO2 96%   BMI 41.16 kg/m   Physical Exam Vitals and nursing note reviewed.  Constitutional:      General: He is not in acute distress.    Appearance: He is well-developed.  HENT:     Head: Normocephalic and atraumatic.  Eyes:     Conjunctiva/sclera: Conjunctivae normal.  Cardiovascular:     Rate and Rhythm: Normal rate and regular rhythm.     Heart sounds: No murmur heard. Pulmonary:     Effort: Pulmonary effort is normal. No respiratory distress.     Breath sounds: Normal breath sounds.  Abdominal:     Palpations: Abdomen is soft.     Tenderness: There is no abdominal tenderness.  Musculoskeletal:        General: No swelling.     Cervical back: Neck supple.     Comments: Full range of motion left knee, ankle, digits.  Pedal and posttibial pulses 2+ bilaterally.  No overlying erythema, palpable fluctuance/induration.  Tender palpation about mid calf without palpable cord/nodule.  Skin:    General: Skin is warm and dry.  Capillary Refill: Capillary refill takes less than 2 seconds.  Neurological:     Mental Status: He is alert.  Psychiatric:        Mood and Affect: Mood normal.     (all labs ordered are listed, but only abnormal results are displayed) Labs Reviewed - No data to display  EKG: None  Radiology: US  Venous Img Lower  Left (DVT Study) Result Date: 09/10/2023 CLINICAL DATA:  LEFT leg pain for 3 days EXAM: LEFT LOWER EXTREMITY VENOUS DOPPLER ULTRASOUND TECHNIQUE: Gray-scale sonography with compression, as well as color and duplex ultrasound, were performed to evaluate the deep venous system(s) from the level of the common femoral vein through the popliteal and proximal calf veins. COMPARISON:  None available FINDINGS: VENOUS Normal  compressibility of the common femoral, superficial femoral, and popliteal veins, as well as the visualized calf veins. Visualized portions of profunda femoral vein and great saphenous vein unremarkable. No filling defects to suggest DVT on grayscale or color Doppler imaging. Doppler waveforms show normal direction of venous flow, normal respiratory plasticity and response to augmentation. Limited views of the contralateral common femoral vein are unremarkable. OTHER None. Limitations: none IMPRESSION: No DVT of the left lower extremity. Electronically Signed   By: Aliene Lloyd M.D.   On: 09/10/2023 13:28     Procedures   Medications Ordered in the ED - No data to display                                  Medical Decision Making Risk Prescription drug management.   This patient presents to the ED for concern of calf pain, this involves an extensive number of treatment options, and is a complaint that carries with it a high risk of complications and morbidity.  The differential diagnosis includes DVT, strain/sprain, compartment syndrome from cellulitis, or Supples, necrotizing infection, ischemic limb, other   Co morbidities that complicate the patient evaluation  See HPI   Additional history obtained:  Additional history obtained from EMR External records from outside source obtained and reviewed including hospital records   Lab Tests:  N/a   Imaging Studies ordered:  N/a   Cardiac Monitoring: / EKG:  N/a   Consultations Obtained:  N/a   Problem List / ED Course / Critical interventions / Medication management  Left calf pain Reevaluation of the patient showed that the patient stayed the same I have reviewed the patients home medicines and have made adjustments as needed   Social Determinants of Health:  Denies tobacco, illicit drug use.   Test / Admission - Considered:  Left calf pain Vitals signs within normal range and stable throughout  visit. Imaging studies significant for: See above 63 year old male presents emergency department complaints of left calf pain.  States that he has been having this since Saturday.  Woke up with this discomfort.  States it is worsened with walking and certain positions.  Denies any known falls/traumas.  Denies any history of DVT.  Was sent by primary care for DVT rule out.  Denies any chest pain, shortness of breath. On exam, tenderness to palpation left mid calf as above.  No overlying skin changes concerning for second infectious process.  No pulse deficits to suggest ischemic limb.  No obvious bony tenderness or reported traumatic mechanism to be concerned for fracture or dislocation.  DVT ultrasound was obtained by triage staff which was negative for any acute osseous abnormality.  Suspect muscular etiology of symptoms.  Will recommend symptomatic therapy as an AVS with follow-up with PCP in the outpatient setting.  Treatment plan discussed with patient and he acknowledged understanding was agreeable to said plan.  Patient well-appearing, afebrile in no acute distress. Worrisome signs and symptoms were discussed with the patient, and the patient acknowledged understanding to return to the ED if noticed. Patient was stable upon discharge.       Final diagnoses:  None    ED Discharge Orders          Ordered    celecoxib  (CELEBREX ) 200 MG capsule  2 times daily PRN        09/10/23 1357    cyclobenzaprine  (FLEXERIL ) 10 MG tablet  2 times daily PRN        09/10/23 1357               Silver Wonda LABOR, [place] 09/10/23 1404    Dasie Faden, MD 09/11/23 [id number]

## 2023-09-10 NOTE — ED Triage Notes (Signed)
 L calf pain x 2 days. Sent by PCP to r/o DVT. No known injury.

## 2023-09-10 NOTE — Discharge Instructions (Signed)
 As discussed, ultrasound of your left leg did not show evidence of blood clot.  You have good pulses to your legs so I do not think this is a ischemic limb.  You do not have skin changes concerning for bacterial infectious process.  Suspect that it is a muscular abnormality.  Will send you home with anti-inflammatories to use as needed as well as a muscle relaxer.  Muscle laxer can cause drowsiness so please do not drive or perform any high-risk activity and utilize its effects on you.  Recommend follow-up with your primary care.  As we discussed, the initial ultrasound can miss blood clots so if you continue to have symptoms after several days, please return to primary care for further assessment regarding your calf pain

## 2023-09-10 NOTE — Telephone Encounter (Signed)
 FYI Only or Action Required?: FYI only for provider.  Patient was last seen in primary care on 05/03/2023 by Joyce Norleen BROCKS, MD.  Called Nurse Triage reporting Leg Pain.  Symptoms began several days ago.  Interventions attempted: Nothing.  Symptoms are: gradually worsening.  Triage Disposition: See HCP Within 4 Hours (Or PCP Triage) To ER Patient/caregiver understands and will follow disposition?: Yes             Copied from CRM #[id number]. Topic: Clinical - Red Word Triage >> Sep 10, 2023 11:12 AM Graeme ORN wrote: Red Word that prompted transfer to Nurse Triage: Awful pain in left leg - hard to do anything Reason for Disposition  [1] Thigh or calf pain AND [2] only 1 side AND [3] present > 1 hour  (Exception: Chronic unchanged pain.)  Answer Assessment - Initial Assessment Questions 1. ONSET: When did the pain start?      Saturday 2. LOCATION: Where is the pain located?      Left leg 3. PAIN: How bad is the pain?    (Scale 1-10; or mild, moderate, severe)     Mostly calf, severe 4. WORK OR EXERCISE: Has there been any recent work or exercise that involved this part of the body?      denies 5. CAUSE: What do you think is causing the leg pain?     Unknown, mostly calf pain, denies redness, swelling 6. OTHER SYMPTOMS: Do you have any other symptoms? (e.g., chest pain, back pain, breathing difficulty, swelling, rash, fever, numbness, weakness)     Lower back pain 7. PREGNANCY: Is there any chance you are pregnant? When was your last menstrual period?     na  Protocols used: Leg Pain-A-AH

## 2023-09-12 ENCOUNTER — Encounter: Payer: BC Managed Care – PPO | Admitting: Family Medicine

## 2023-09-21 ENCOUNTER — Ambulatory Visit: Payer: Self-pay

## 2023-09-21 NOTE — Telephone Encounter (Signed)
 FYI Only or Action Required?: FYI only for provider.  Patient was last seen in primary care on 05/03/2023 by Joyce Norleen BROCKS, MD.  Called Nurse Triage reporting Leg Pain.  Symptoms began several weeks ago.  Interventions attempted: Prescription medications: Celebrex  and muscle relaxer.  Symptoms are: gradually improving.  Triage Disposition: See PCP When Office is Open (Within 3 Days)  Patient/caregiver understands and will follow disposition?: Yes    Copied from CRM #[id number]. Topic: Clinical - Red Word Triage >> Sep 21, 2023 11:32 AM Ahlexyia S wrote: Red Word that prompted transfer to Nurse Triage: Pt stated he has been having severe leg pain for the past two weeks. He stated that if he puts any weight on leg it hurts. Reason for Disposition  [1] MODERATE pain (e.g., interferes with normal activities, limping) AND [2] present > 3 days  Answer Assessment - Initial Assessment Questions 1. ONSET: When did the pain start?      2 weeks 2. LOCATION: Where is the pain located?      Left leg in the calf  3. PAIN: How bad is the pain?    (Scale 1-10; or mild, moderate, severe)     6/10 4. WORK OR EXERCISE: Has there been any recent work or exercise that involved this part of the body?      no 5. CAUSE: What do you think is causing the leg pain?     Thinks its from his lower back pain 6. OTHER SYMPTOMS: Do you have any other symptoms? (e.g., chest pain, back pain, breathing difficulty, swelling, rash, fever, numbness, weakness)     Back pain-   Went to ED last week and they did an US  on the leg and did not find a blockage.  Protocols used: Leg Pain-A-AH

## 2023-09-25 ENCOUNTER — Encounter: Payer: Self-pay | Admitting: Family Medicine

## 2023-09-25 ENCOUNTER — Ambulatory Visit (INDEPENDENT_AMBULATORY_CARE_PROVIDER_SITE_OTHER): Admitting: Family Medicine

## 2023-09-25 VITALS — BP 136/84 | HR 91 | Ht 66.0 in | Wt 261.0 lb

## 2023-09-25 DIAGNOSIS — M79662 Pain in left lower leg: Secondary | ICD-10-CM | POA: Diagnosis not present

## 2023-09-25 MED ORDER — CELECOXIB 200 MG PO CAPS: 200.0000 mg | ORAL_CAPSULE | Freq: Two times a day (BID) | ORAL | 0 refills | Status: AC | PRN

## 2023-09-25 NOTE — Progress Notes (Signed)
   Subjective:    Patient ID: Thomas Blankenship, male    DOB: 06-09-1960, 64 y.o.   MRN: [id number]  Discussed the use of AI scribe software for clinical note transcription with the patient, who gave verbal consent to proceed.  History of Present Illness   Thomas Blankenship is a 63 year old male who presents with left calf pain.  He has been experiencing pain in his left calf for approximately two weeks, beginning on September 10, 2023. The discomfort initially started upon waking and worsens with walking and standing for extended periods, while it diminishes with rest. He denies any history of blood clots.  Two weeks ago, he visited the emergency room where an ultrasound was performed, revealing no blood clots. He was prescribed Celebrex  and a muscle relaxer. Celebrex  provides more relief, and he takes the muscle relaxer at night.  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 The pain is localized to the left calf, with no pain in the right leg. No skin changes or other pains except for occasional lower back pain on the same side.  No chest pain, shortness of breath, or other heart-related symptoms. He recalls having a similar issue checked before the pandemic, approximately five years ago.           Review of Systems     Objective:    Physical Exam Physical Exam   EXTREMITIES: Skin on foot normal, no hair changes. Foot non-tender to palpation. Pulse in foot not palpable. MUSCULOSKELETAL: Hip range of motion normal. Straight leg raise test negative. NEUROLOGICAL: Reflexes normal bilaterally.       In office Doppler could not determine a pulse.  Capillary refill Good.     Assessment & Plan:  Assessment and Plan     Left calf pain with claudication symptoms suggests peripheral arterial disease. Ultrasound ruled out venous thrombosis. Differential diagnosis includes arterial blockage. Negative straight leg raise test and good hip  motion reduce likelihood of nerve impingement or musculoskeletal causes. - Order arterial Doppler study for left leg to assess for arterial blockage. - Consider referral to vascular specialists for further evaluation and possible stent placement if blockage is confirmed. - Explained that a stent may be placed to improve blood flow, avoiding major surgery. Recent advancements allow for minimally invasive procedures with quick recovery. - Renew prescription for Celebrex  and continue as needed for pain management. - Arrange follow-up based on Doppler results.   Celebrex  called in

## 2023-10-05 ENCOUNTER — Ambulatory Visit (HOSPITAL_BASED_OUTPATIENT_CLINIC_OR_DEPARTMENT_OTHER)
Admission: RE | Admit: 2023-10-05 | Discharge: 2023-10-05 | Disposition: A | Discharge status: Home / Self Care | Source: Ambulatory Visit | Attending: Vascular Surgery | Admitting: Vascular Surgery

## 2023-10-05 ENCOUNTER — Ambulatory Visit (HOSPITAL_COMMUNITY)
Admission: RE | Admit: 2023-10-05 | Discharge: 2023-10-05 | Disposition: A | Discharge status: Home / Self Care | Source: Ambulatory Visit | Attending: Family Medicine | Admitting: Family Medicine

## 2023-10-05 ENCOUNTER — Telehealth: Payer: Self-pay | Admitting: Internal Medicine

## 2023-10-05 DIAGNOSIS — M79662 Pain in left lower leg: Secondary | ICD-10-CM

## 2023-10-05 NOTE — Telephone Encounter (Signed)
 Copied from CRM [id number]. Topic: Referral - Question >> Oct 05, 2023  8:41 AM Roselie BROCKS wrote: Reason for CRM: Felisha from [facility] needs a return call to her today, the patient is coming into the appointment for lower leg, but also shows needs ABI but they do not have ABI orders.  ABI orders need to be sent ASAP.   Direct phone number is [phone]

## 2023-10-05 NOTE — Telephone Encounter (Signed)
 Placed order and spoke with [facility]   Copied from CRM [id number]. Topic: Clinical - Lab/Test Results >> Oct 05, 2023  8:45 AM Amy B wrote: Reason for CRM: Bri[facility] [address] states they do not have an order for an ankle brachial index.  She cannot do the test without the order.  Please call her at [phone].  She state she has reached out twice and has not heard back.

## 2023-10-07 LAB — VAS US ABI WITH/WO TBI
Left ABI: 1.2
Right ABI: 1.21

## 2023-10-10 ENCOUNTER — Ambulatory Visit: Payer: Self-pay | Admitting: Family Medicine

## 2023-10-18 ENCOUNTER — Encounter: Admitting: Family Medicine

## 2023-10-30 ENCOUNTER — Encounter: Payer: Self-pay | Admitting: Family Medicine

## 2023-10-30 ENCOUNTER — Ambulatory Visit: Admitting: Family Medicine

## 2023-10-30 VITALS — BP 114/72 | HR 96 | Ht 66.0 in | Wt 256.0 lb

## 2023-10-30 DIAGNOSIS — R3914 Feeling of incomplete bladder emptying: Secondary | ICD-10-CM

## 2023-10-30 DIAGNOSIS — E291 Testicular hypofunction: Secondary | ICD-10-CM

## 2023-10-30 DIAGNOSIS — E7800 Pure hypercholesterolemia, unspecified: Secondary | ICD-10-CM

## 2023-10-30 DIAGNOSIS — N401 Enlarged prostate with lower urinary tract symptoms: Secondary | ICD-10-CM | POA: Diagnosis not present

## 2023-10-30 DIAGNOSIS — Z79890 Hormone replacement therapy (postmenopausal): Secondary | ICD-10-CM | POA: Diagnosis not present

## 2023-10-30 DIAGNOSIS — Z0000 Encounter for general adult medical examination without abnormal findings: Secondary | ICD-10-CM

## 2023-10-30 LAB — LIPID PANEL

## 2023-10-30 NOTE — Patient Instructions (Addendum)
 Follow up in 6 months  Monitor your urinary symptoms  Continue with walking exercise therapy

## 2023-10-30 NOTE — Progress Notes (Signed)
 Name: Thomas Blankenship   Date of Visit: 10/30/23   Date of last visit with me: Visit date not found   CHIEF COMPLAINT:  Chief Complaint  Patient presents with   Annual Exam    Cpe.        HPI:  Discussed the use of AI scribe software for clinical note transcription with the patient, who gave verbal consent to proceed.  History of Present Illness   Thomas Blankenship is a 63 year old male who presents for a routine follow-up visit.  He is currently on testosterone  therapy and reports that it is working well, with no return of fatigue.  He takes Ambien  for sleep, which he finds helpful in maintaining sleep throughout the night.  He experiences urinary issues, including a sensation of incomplete emptying and increased frequency.  He has a history of elevated cholesterol, which was high a year ago but had decreased six months ago. He is not currently on any cholesterol medication.  He had an ultrasound of his legs on September 5th due to pain in his left leg, which was suspected to be related to vascular issues. The ultrasound showed normal results, and he reports that the pain is improving.  He uses a CPAP machine at night and is up to date on most vaccinations, although he has not yet received his flu shot this season.  No recent fatigue or other bothersome symptoms. No new concerns regarding his health.         OBJECTIVE:       10/30/2023    1:42 PM  Depression screen PHQ 2/9  Decreased Interest 0  Down, Depressed, Hopeless 0  PHQ - 2 Score 0  Altered sleeping 1  Tired, decreased energy 1  Change in appetite 0  Feeling bad or failure about yourself  0  Trouble concentrating 0  Moving slowly or fidgety/restless 0  Suicidal thoughts 0  PHQ-9 Score 2     BP Readings from Last 3 Encounters:  10/30/23 114/72  09/25/23 136/84  09/10/23 126/85    BP 114/72   Pulse 96   Ht 5' 6 (1.676 m)   Wt 256 lb (116.1 kg)   SpO2 99%   BMI 41.32 kg/m     Physical Exam          Physical Exam Constitutional:      Appearance: Normal appearance.  Neurological:     General: No focal deficit present.     Mental Status: He is alert and oriented to person, place, and time. Mental status is at baseline.     ASSESSMENT/PLAN:   Assessment & Plan Annual physical exam  Benign prostatic hyperplasia with incomplete bladder emptying  Hypercholesteremia  Hypogonadism male  Long-term current use of testosterone  replacement therapy    Assessment and Plan    Lower urinary tract symptoms Mild symptoms of incomplete emptying and increased frequency. Not bothersome enough for medication. - Monitor symptoms and consider medication if symptoms worsen. - Order PSA test.  Left leg pain Pain improving. Normal arterial flow on previous ultrasounds. No vascular intervention needed. - Encourage walking to improve vascular health. - Monitor symptoms and consider referral to vascular surgeon if pain worsens.  Testosterone  deficiency Current treatment effective without symptoms. Recent testosterone  levels not checked. - Order testosterone  level.  Hyperlipidemia Previous high cholesterol improved six months ago. Current levels need assessment. - Order lipid panel. - Consider starting cholesterol medication if levels are high.  Insomnia Managed with Ambien ,  effective for sleep.  Obstructive sleep apnea Effective CPAP use at night.  General Health Maintenance Up to date on most immunizations and screenings. Flu and COVID vaccines recommended. HIV screening required by state. - Administer flu and COVID vaccines. - Include HIV screening in labs.  -- Full history and exam completed today in addition to [facility]. Reviewed interval concerns, chronic conditions, and preventive care needs. Physical exam performed. Counseling provided on lifestyle, screenings, vaccines, and routine health maintenance.         Thomas Blankenship A. Vita  MD [facility]

## 2023-10-31 ENCOUNTER — Ambulatory Visit: Payer: Self-pay | Admitting: Family Medicine

## 2023-11-01 LAB — COMPREHENSIVE METABOLIC PANEL WITH GFR
ALT: 17 IU/L (ref 0–44)
AST: 17 IU/L (ref 0–40)
Albumin: 4 g/dL (ref 3.9–4.9)
Alkaline Phosphatase: 107 IU/L (ref 47–123)
BUN/Creatinine Ratio: 12 (ref 10–24)
BUN: 16 mg/dL (ref 8–27)
Bilirubin Total: 0.4 mg/dL (ref 0.0–1.2)
CO2: 23 mmol/L (ref 20–29)
Calcium: 9.3 mg/dL (ref 8.6–10.2)
Chloride: 100 mmol/L (ref 96–106)
Creatinine, Ser: 1.29 mg/dL — AB (ref 0.76–1.27)
Globulin, Total: 2.7 g/dL (ref 1.5–4.5)
Glucose: 107 mg/dL — AB (ref 70–99)
Potassium: 4 mmol/L (ref 3.5–5.2)
Sodium: 141 mmol/L (ref 134–144)
Total Protein: 6.7 g/dL (ref 6.0–8.5)
eGFR: 63 mL/min/1.73 (ref >59)

## 2023-11-01 LAB — CBC WITH DIFFERENTIAL/PLATELET
Basophils Absolute: 0.1 x10E3/uL (ref 0.0–0.2)
Basos: 1 %
EOS (ABSOLUTE): 0.4 x10E3/uL (ref 0.0–0.4)
Eos: 5 %
Hematocrit: 53.5 % — ABNORMAL HIGH (ref 37.5–51.0)
Hemoglobin: 17.8 g/dL — ABNORMAL HIGH (ref 13.0–17.7)
Immature Grans (Abs): 0 x10E3/uL (ref 0.0–0.1)
Immature Granulocytes: 0 %
Lymphocytes Absolute: 1.6 x10E3/uL (ref 0.7–3.1)
Lymphs: 20 %
MCH: 29.9 pg (ref 26.6–33.0)
MCHC: 33.3 g/dL (ref 31.5–35.7)
MCV: 90 fL (ref 79–97)
Monocytes Absolute: 0.7 x10E3/uL (ref 0.1–0.9)
Monocytes: 9 %
Neutrophils Absolute: 5.1 x10E3/uL (ref 1.4–7.0)
Neutrophils: 65 %
Platelets: 329 x10E3/uL (ref 150–450)
RBC: 5.96 x10E6/uL — ABNORMAL HIGH (ref 4.14–5.80)
RDW: 13 % (ref 11.6–15.4)
WBC: 7.9 x10E3/uL (ref 3.4–10.8)

## 2023-11-01 LAB — PSA: Prostate Specific Ag, Serum: 2.8 ng/mL (ref 0.0–4.0)

## 2023-11-01 LAB — TESTOSTERONE,FREE AND TOTAL
Testosterone, Free: 16.2 pg/mL (ref 6.6–18.1)
Testosterone: 976 ng/dL — AB (ref 264–916)

## 2023-11-01 LAB — LIPID PANEL
Cholesterol, Total: 183 mg/dL (ref 100–199)
HDL: 41 mg/dL (ref >39)
LDL CALC COMMENT:: 4.5 ratio (ref 0.0–5.0)
LDL Chol Calc (NIH): 111 mg/dL — AB (ref 0–99)
Triglycerides: 175 mg/dL — AB (ref 0–149)
VLDL Cholesterol Cal: 31 mg/dL (ref 5–40)

## 2023-11-01 LAB — HIV ANTIBODY (ROUTINE TESTING W REFLEX): HIV Screen 4th Generation wRfx: NONREACTIVE

## 2023-12-04 ENCOUNTER — Other Ambulatory Visit: Payer: Self-pay | Admitting: Family Medicine

## 2023-12-04 DIAGNOSIS — E291 Testicular hypofunction: Secondary | ICD-10-CM

## 2023-12-07 ENCOUNTER — Telehealth: Payer: Self-pay

## 2023-12-07 DIAGNOSIS — E291 Testicular hypofunction: Secondary | ICD-10-CM

## 2023-12-07 MED ORDER — TESTOSTERONE 1.62 % TD GEL
TRANSDERMAL | 0 refills | Status: AC
Start: 2023-12-07 — End: ?

## 2023-12-07 NOTE — Telephone Encounter (Signed)
 Testerone gel

## 2023-12-07 NOTE — Telephone Encounter (Signed)
 Copied from CRM #[id number]. Topic: General - Other >> Dec 07, 2023  1:17 PM Santiya F wrote: Reason for CRM: Patient is calling in because he has an appointment for 12/10/23 to check how the medication he is on is doing. Patient says he has been out of it for a week and wants to know will that affect the testing. He is requesting a call back.

## 2023-12-07 NOTE — Addendum Note (Signed)
 Addended by: Geniva Lohnes on: 12/07/2023 03:48 PM   Modules accepted: Orders

## 2023-12-10 ENCOUNTER — Ambulatory Visit: Payer: Self-pay | Admitting: Family Medicine

## 2023-12-12 ENCOUNTER — Other Ambulatory Visit (HOSPITAL_COMMUNITY): Payer: Self-pay

## 2023-12-12 ENCOUNTER — Telehealth: Payer: Self-pay

## 2023-12-12 NOTE — Telephone Encounter (Signed)
 Pharmacy Patient Advocate Encounter  Received notification from [facility] that Prior Authorization for Testosterone  1.62 % GEL has been APPROVED  to 11.12.28 . Ran test claim, Copay is $5.00. This test claim was processed through [facility]- copay amounts may vary at other pharmacies due to pharmacy/plan contracts, or as the patient moves through the different stages of their insurance plan.   PA #/Case ID/Reference #: [id number]

## 2024-05-01 ENCOUNTER — Ambulatory Visit: Payer: Self-pay | Admitting: Family Medicine
